# Patient Record
Sex: Male | Born: 1945
Health system: Southern US, Community
[De-identification: ages and names within clinical notes are randomized; demographics above are authoritative.]

## PROBLEM LIST (undated history)

## (undated) DIAGNOSIS — I10 Essential (primary) hypertension: Secondary | ICD-10-CM

## (undated) DIAGNOSIS — K922 Gastrointestinal hemorrhage, unspecified: Secondary | ICD-10-CM

## (undated) DIAGNOSIS — E119 Type 2 diabetes mellitus without complications: Secondary | ICD-10-CM

## (undated) DIAGNOSIS — E7800 Pure hypercholesterolemia, unspecified: Secondary | ICD-10-CM

## (undated) DIAGNOSIS — E079 Disorder of thyroid, unspecified: Secondary | ICD-10-CM

## (undated) HISTORY — PX: PACEMAKER INSERTION: SHX728

---

## 2014-03-24 ENCOUNTER — Encounter (HOSPITAL_BASED_OUTPATIENT_CLINIC_OR_DEPARTMENT_OTHER): Payer: Self-pay

## 2014-03-24 ENCOUNTER — Emergency Department (HOSPITAL_BASED_OUTPATIENT_CLINIC_OR_DEPARTMENT_OTHER): Payer: Medicare Other

## 2014-03-24 ENCOUNTER — Emergency Department (HOSPITAL_BASED_OUTPATIENT_CLINIC_OR_DEPARTMENT_OTHER)
Admission: EM | Admit: 2014-03-24 | Discharge: 2014-03-25 | Disposition: A | Discharge status: Home / Self Care | Payer: Medicare Other | Attending: Emergency Medicine | Admitting: Emergency Medicine

## 2014-03-24 DIAGNOSIS — E780 Pure hypercholesterolemia: Secondary | ICD-10-CM | POA: Insufficient documentation

## 2014-03-24 DIAGNOSIS — E079 Disorder of thyroid, unspecified: Secondary | ICD-10-CM | POA: Diagnosis not present

## 2014-03-24 DIAGNOSIS — R109 Unspecified abdominal pain: Secondary | ICD-10-CM | POA: Insufficient documentation

## 2014-03-24 DIAGNOSIS — Z8719 Personal history of other diseases of the digestive system: Secondary | ICD-10-CM | POA: Insufficient documentation

## 2014-03-24 DIAGNOSIS — R609 Edema, unspecified: Secondary | ICD-10-CM | POA: Diagnosis not present

## 2014-03-24 DIAGNOSIS — G5601 Carpal tunnel syndrome, right upper limb: Secondary | ICD-10-CM | POA: Diagnosis not present

## 2014-03-24 DIAGNOSIS — Z789 Other specified health status: Secondary | ICD-10-CM

## 2014-03-24 DIAGNOSIS — G5603 Carpal tunnel syndrome, bilateral upper limbs: Secondary | ICD-10-CM

## 2014-03-24 DIAGNOSIS — Z79899 Other long term (current) drug therapy: Secondary | ICD-10-CM | POA: Diagnosis not present

## 2014-03-24 DIAGNOSIS — G5602 Carpal tunnel syndrome, left upper limb: Secondary | ICD-10-CM | POA: Diagnosis not present

## 2014-03-24 DIAGNOSIS — I10 Essential (primary) hypertension: Secondary | ICD-10-CM | POA: Diagnosis not present

## 2014-03-24 HISTORY — DX: Essential (primary) hypertension: I10

## 2014-03-24 HISTORY — DX: Disorder of thyroid, unspecified: E07.9

## 2014-03-24 HISTORY — DX: Pure hypercholesterolemia, unspecified: E78.00

## 2014-03-24 HISTORY — DX: Gastrointestinal hemorrhage, unspecified: K92.2

## 2014-03-24 LAB — CBC WITH DIFFERENTIAL/PLATELET
Basophils Absolute: 0 10*3/uL (ref 0.0–0.1)
Basophils Relative: 0 % (ref 0–1)
EOS ABS: 0 10*3/uL (ref 0.0–0.7)
EOS PCT: 1 % (ref 0–5)
HCT: 40.3 % (ref 39.0–52.0)
Hemoglobin: 13.3 g/dL (ref 13.0–17.0)
Lymphocytes Relative: 27 % (ref 12–46)
Lymphs Abs: 1.3 10*3/uL (ref 0.7–4.0)
MCH: 30.7 pg (ref 26.0–34.0)
MCHC: 33 g/dL (ref 30.0–36.0)
MCV: 93.1 fL (ref 78.0–100.0)
Monocytes Absolute: 0.6 10*3/uL (ref 0.1–1.0)
Monocytes Relative: 12 % (ref 3–12)
Neutro Abs: 2.9 10*3/uL (ref 1.7–7.7)
Neutrophils Relative %: 60 % (ref 43–77)
PLATELETS: 170 10*3/uL (ref 150–400)
RBC: 4.33 MIL/uL (ref 4.22–5.81)
RDW: 14.3 % (ref 11.5–15.5)
WBC: 4.7 10*3/uL (ref 4.0–10.5)

## 2014-03-24 MED ORDER — SODIUM CHLORIDE 0.9 % IV SOLN
INTRAVENOUS | Status: DC
  Administered 2014-03-24: via INTRAVENOUS

## 2014-03-24 NOTE — ED Notes (Addendum)
C/o abd pain x "months" then states "ain't no pain just feel full"

## 2014-03-24 NOTE — ED Notes (Signed)
Oral contrast given by CT.  Pt is drinking.

## 2014-03-24 NOTE — ED Provider Notes (Signed)
CSN: [id number]     Arrival date & time 03/24/14  2210 History  This chart was scribed for Hanley Seamen, MD by Abel Presto, ED Scribe. This patient was seen in room MH06/MH06 and the patient's care was started at 11:32 PM.    Chief Complaint  Patient presents with  . Abdominal Pain    Patient is a 68 y.o. male presenting with abdominal pain. The history is provided by the patient. No language interpreter was used.  Abdominal Pain   HPI Comments: Luiz Trumpower is a 68 y.o. male who presents to the Emergency Department complaining of intermittent abdominal tightness--but not frank pain--for the past two or three months. He states it feels like he's "eaten a whole chicken". He states eating worsens the feeling. He notes associated mild constipation. He has taken stool softener but states moving his bowels gives no relief. Pt notes incidentally his ankles have been swollen. He also notes pain and numbness in his hands onset 2 years ago and he has been dropping keys recently. [facility] him but he has been reticent to have this done. Pt denies nausea, vomiting, diarrhea and bloody stools.  Past Medical History  Diagnosis Date  . Hypertension   . Thyroid disease   . High cholesterol   . GI bleed    Past Surgical History  Procedure Laterality Date  . Pacemaker insertion     No family history on file. History  Substance Use Topics  . Smoking status: Never Smoker   . Smokeless tobacco: Not on file  . Alcohol Use: No    Review of Systems  Gastrointestinal: Positive for abdominal pain.   A complete 10 system review of systems was obtained and all systems are negative except as noted in the HPI and PMH.     Allergies  Review of patient's allergies indicates no known allergies.  Home Medications   Prior to Admission medications   Medication Sig Start Date End Date Taking? Authorizing Provider  AMIODARONE HCL PO Take by mouth.   Yes Historical  Provider, MD  ATORVASTATIN CALCIUM PO Take by mouth.   Yes Historical Provider, MD  carvedilol (COREG) 25 MG tablet Take 25 mg by mouth 2 (two) times daily with a meal.   Yes Historical Provider, MD  clopidogrel (PLAVIX) 75 MG tablet Take 75 mg by mouth daily.   Yes Historical Provider, MD  glipiZIDE (GLUCOTROL) 10 MG tablet Take 10 mg by mouth daily before breakfast.   Yes Historical Provider, MD  LEVOTHYROXINE SODIUM PO Take by mouth.   Yes Historical Provider, MD  lisinopril (PRINIVIL,ZESTRIL) 10 MG tablet Take 10 mg by mouth daily.   Yes Historical Provider, MD   BP 174/88 mmHg  Pulse 51  Temp(Src) 98.7 F (37.1 C) (Oral)  Resp 16  Ht 6\' 1"  (1.854 m)  Wt 209 lb (94.802 kg)  BMI 27.58 kg/m2  SpO2 99% Physical Exam General: Well-developed, well-nourished male in no acute distress; appearance consistent with age of record HENT: normocephalic; atraumatic Eyes: pupils equal, round and reactive to light; extraocular muscles intact Neck: supple Heart: regular rate and rhythm; no murmurs, rubs or gallops Lungs: clear to auscultation bilaterally Abdomen: soft; nondistended; mild diffused tenderness; no masses or hepatosplenomegaly; bowel sounds present Extremities: No deformity; full range of motion; pulses normal, +1 pitting edema of lower legs; positive Tinel's; positive Phalen's Neurologic: Awake, alert and oriented; motor function intact in all extremities and symmetric; no facial droop Skin:  Warm and dry Psychiatric: Normal mood and affect   ED Course  Procedures (including critical care time) DIAGNOSTIC STUDIES: Oxygen Saturation is 100% on room air, normal by my interpretation.    COORDINATION OF CARE: 11:43 PM Discussed treatment plan with patient at beside, the patient agrees with the plan and has no further questions at this time.     MDM   Nursing notes and vitals signs, including pulse oximetry, reviewed.  Summary of this visit's results, reviewed by  myself:  Labs:  Results for orders placed or performed during the hospital encounter of 03/24/14 (from the past 24 hour(s))  CBC with Differential     Status: None   Collection Time: 03/24/14 11:50 PM  Result Value Ref Range   WBC 4.7 4.0 - 10.5 K/uL   RBC 4.33 4.22 - 5.81 MIL/uL   Hemoglobin 13.3 13.0 - 17.0 g/dL   HCT 96.240.3 95.239.0 - 84.152.0 %   MCV 93.1 78.0 - 100.0 fL   MCH 30.7 26.0 - 34.0 pg   MCHC 33.0 30.0 - 36.0 g/dL   RDW 32.414.3 40.111.5 - 02.715.5 %   Platelets 170 150 - 400 K/uL   Neutrophils Relative % 60 43 - 77 %   Neutro Abs 2.9 1.7 - 7.7 K/uL   Lymphocytes Relative 27 12 - 46 %   Lymphs Abs 1.3 0.7 - 4.0 K/uL   Monocytes Relative 12 3 - 12 %   Monocytes Absolute 0.6 0.1 - 1.0 K/uL   Eosinophils Relative 1 0 - 5 %   Eosinophils Absolute 0.0 0.0 - 0.7 K/uL   Basophils Relative 0 0 - 1 %   Basophils Absolute 0.0 0.0 - 0.1 K/uL  Comprehensive metabolic panel     Status: Abnormal   Collection Time: 03/24/14 11:50 PM  Result Value Ref Range   Sodium 142 135 - 145 mmol/L   Potassium 3.9 3.5 - 5.1 mmol/L   Chloride 107 96 - 112 mEq/L   CO2 29 19 - 32 mmol/L   Glucose, Bld 97 70 - 99 mg/dL   BUN 18 6 - 23 mg/dL   Creatinine, Ser 2.531.24 0.50 - 1.35 mg/dL   Calcium 9.0 8.4 - 66.410.5 mg/dL   Total Protein 6.3 6.0 - 8.3 g/dL   Albumin 3.7 3.5 - 5.2 g/dL   AST 18 0 - 37 U/L   ALT 11 0 - 53 U/L   Alkaline Phosphatase 49 39 - 117 U/L   Total Bilirubin 0.6 0.3 - 1.2 mg/dL   GFR calc non Af Amer 58 (L) >90 mL/min   GFR calc Af Amer 67 (L) >90 mL/min   Anion gap 6 5 - 15  Lipase, blood     Status: None   Collection Time: 03/24/14 11:50 PM  Result Value Ref Range   Lipase 23 11 - 59 U/L    Imaging Studies: Ct Abdomen Pelvis W Contrast  03/25/2014   CLINICAL DATA:  Abdominal pain, bloating, constipation  EXAM: CT ABDOMEN AND PELVIS WITH CONTRAST  TECHNIQUE: Multidetector CT imaging of the abdomen and pelvis was performed using the standard protocol following bolus administration of  intravenous contrast.  CONTRAST:  100mL OMNIPAQUE IOHEXOL 300 MG/ML  SOLN  COMPARISON:  None.  FINDINGS: Lower chest:  Lung bases are clear.  ICD leads, incompletely visualized.  Hepatobiliary: Liver is within normal limits.  Gallbladder is unremarkable. No intrahepatic or extrahepatic ductal dilatation.  Pancreas: Within normal limits.  Spleen: Within normal limits.  Adrenals/Urinary Tract: Adrenal glands  are unremarkable.  9 mm cyst in the posterior left upper kidney (series 2/image 30). Kidneys are within normal limits. No renal calculi or hydronephrosis.  Bladder is within normal limits.  Stomach/Bowel: Stomach is unremarkable.  No evidence of bowel obstruction.  Normal appendix.  Vascular/Lymphatic: No evidence of abdominal aortic aneurysm.  No suspicious abdominopelvic lymphadenopathy.  Reproductive: Mild prostatomegaly.  Other: No abdominopelvic ascites.  Musculoskeletal: Degenerative changes of the visualized thoracolumbar spine.  IMPRESSION: No evidence of bowel obstruction.  Normal appendix.  No CT findings to account the patient's abdominal pain.   Electronically Signed   By: Charline BillsSriyesh  Krishnan M.D.   On: 03/25/2014 02:35   2:55 AM Patient advised of unremarkable workup. He was advised that over-the-counter medications such as simethicone or laxative may provide symptomatic relief.  I personally performed the services described in this documentation, which was scribed in my presence. The recorded information has been reviewed and is accurate.   Hanley SeamenJohn L Mercadez Heitman, MD 03/25/14 [phone]

## 2014-03-24 NOTE — ED Notes (Signed)
MD at bedside. 

## 2014-03-25 LAB — COMPREHENSIVE METABOLIC PANEL
ALK PHOS: 49 U/L (ref 39–117)
ALT: 11 U/L (ref 0–53)
AST: 18 U/L (ref 0–37)
Albumin: 3.7 g/dL (ref 3.5–5.2)
Anion gap: 6 (ref 5–15)
BILIRUBIN TOTAL: 0.6 mg/dL (ref 0.3–1.2)
BUN: 18 mg/dL (ref 6–23)
CALCIUM: 9 mg/dL (ref 8.4–10.5)
CO2: 29 mmol/L (ref 19–32)
Chloride: 107 mEq/L (ref 96–112)
Creatinine, Ser: 1.24 mg/dL (ref 0.50–1.35)
GFR calc Af Amer: 67 mL/min — ABNORMAL LOW (ref >90)
GFR, EST NON AFRICAN AMERICAN: 58 mL/min — AB (ref >90)
GLUCOSE: 97 mg/dL (ref 70–99)
Potassium: 3.9 mmol/L (ref 3.5–5.1)
Sodium: 142 mmol/L (ref 135–145)
TOTAL PROTEIN: 6.3 g/dL (ref 6.0–8.3)

## 2014-03-25 LAB — LIPASE, BLOOD: Lipase: 23 U/L (ref 11–59)

## 2014-03-25 MED ORDER — IOHEXOL 300 MG/ML  SOLN
25.0000 mL | Freq: Once | INTRAMUSCULAR | Status: AC | PRN
  Administered 2014-03-25: 25 mL via ORAL

## 2014-03-25 MED ORDER — IOHEXOL 300 MG/ML  SOLN
100.0000 mL | Freq: Once | INTRAMUSCULAR | Status: AC | PRN
  Administered 2014-03-25: 100 mL via INTRAVENOUS

## 2014-03-25 NOTE — ED Notes (Signed)
Patient transported to CT 

## 2014-03-25 NOTE — ED Notes (Signed)
MD at bedside discussing test results and dispo plan of care. 

## 2014-07-20 ENCOUNTER — Encounter (HOSPITAL_BASED_OUTPATIENT_CLINIC_OR_DEPARTMENT_OTHER): Payer: Self-pay | Admitting: *Deleted

## 2014-07-20 DIAGNOSIS — E785 Hyperlipidemia, unspecified: Secondary | ICD-10-CM | POA: Insufficient documentation

## 2014-07-20 DIAGNOSIS — I10 Essential (primary) hypertension: Secondary | ICD-10-CM | POA: Insufficient documentation

## 2014-07-20 DIAGNOSIS — Z79899 Other long term (current) drug therapy: Secondary | ICD-10-CM | POA: Diagnosis not present

## 2014-07-20 DIAGNOSIS — Z7902 Long term (current) use of antithrombotics/antiplatelets: Secondary | ICD-10-CM | POA: Insufficient documentation

## 2014-07-20 DIAGNOSIS — R197 Diarrhea, unspecified: Secondary | ICD-10-CM | POA: Insufficient documentation

## 2014-07-20 DIAGNOSIS — E079 Disorder of thyroid, unspecified: Secondary | ICD-10-CM | POA: Diagnosis not present

## 2014-07-20 DIAGNOSIS — Z8719 Personal history of other diseases of the digestive system: Secondary | ICD-10-CM | POA: Diagnosis not present

## 2014-07-20 DIAGNOSIS — Z950 Presence of cardiac pacemaker: Secondary | ICD-10-CM | POA: Insufficient documentation

## 2014-07-20 NOTE — ED Notes (Signed)
Pt here for diarrhea all day today. Pt has had the diarrhea x2 days and took loperamide otc but has not had any relief and has abdominal distention also.  Pt states that he has had about 20 episodes of diarrhea today.  No recent hospitalization.  No fever with this

## 2014-07-21 ENCOUNTER — Emergency Department (HOSPITAL_BASED_OUTPATIENT_CLINIC_OR_DEPARTMENT_OTHER)
Admission: EM | Admit: 2014-07-21 | Discharge: 2014-07-21 | Disposition: A | Discharge status: Home / Self Care | Payer: Medicare Other | Attending: Emergency Medicine | Admitting: Emergency Medicine

## 2014-07-21 ENCOUNTER — Emergency Department (HOSPITAL_BASED_OUTPATIENT_CLINIC_OR_DEPARTMENT_OTHER): Payer: Medicare Other

## 2014-07-21 ENCOUNTER — Encounter (HOSPITAL_BASED_OUTPATIENT_CLINIC_OR_DEPARTMENT_OTHER): Payer: Self-pay | Admitting: Emergency Medicine

## 2014-07-21 DIAGNOSIS — R197 Diarrhea, unspecified: Secondary | ICD-10-CM

## 2014-07-21 LAB — COMPREHENSIVE METABOLIC PANEL
ALT: 15 U/L (ref 0–53)
ANION GAP: 6 (ref 5–15)
AST: 21 U/L (ref 0–37)
Albumin: 3.8 g/dL (ref 3.5–5.2)
Alkaline Phosphatase: 59 U/L (ref 39–117)
BUN: 23 mg/dL (ref 6–23)
CHLORIDE: 108 mmol/L (ref 96–112)
CO2: 26 mmol/L (ref 19–32)
CREATININE: 1.41 mg/dL — AB (ref 0.50–1.35)
Calcium: 9.1 mg/dL (ref 8.4–10.5)
GFR calc Af Amer: 58 mL/min — ABNORMAL LOW (ref >90)
GFR calc non Af Amer: 50 mL/min — ABNORMAL LOW (ref >90)
Glucose, Bld: 81 mg/dL (ref 70–99)
Potassium: 4.3 mmol/L (ref 3.5–5.1)
Sodium: 140 mmol/L (ref 135–145)
Total Bilirubin: 0.6 mg/dL (ref 0.3–1.2)
Total Protein: 7.4 g/dL (ref 6.0–8.3)

## 2014-07-21 LAB — CBC WITH DIFFERENTIAL/PLATELET
BASOS ABS: 0 10*3/uL (ref 0.0–0.1)
BASOS PCT: 0 % (ref 0–1)
EOS ABS: 0.1 10*3/uL (ref 0.0–0.7)
EOS PCT: 1 % (ref 0–5)
HEMATOCRIT: 40.8 % (ref 39.0–52.0)
Hemoglobin: 13.6 g/dL (ref 13.0–17.0)
LYMPHS PCT: 13 % (ref 12–46)
Lymphs Abs: 0.9 10*3/uL (ref 0.7–4.0)
MCH: 31.3 pg (ref 26.0–34.0)
MCHC: 33.3 g/dL (ref 30.0–36.0)
MCV: 93.8 fL (ref 78.0–100.0)
Monocytes Absolute: 1.3 10*3/uL — ABNORMAL HIGH (ref 0.1–1.0)
Monocytes Relative: 19 % — ABNORMAL HIGH (ref 3–12)
NEUTROS ABS: 4.7 10*3/uL (ref 1.7–7.7)
Neutrophils Relative %: 67 % (ref 43–77)
PLATELETS: 178 10*3/uL (ref 150–400)
RBC: 4.35 MIL/uL (ref 4.22–5.81)
RDW: 14.6 % (ref 11.5–15.5)
WBC: 7.1 10*3/uL (ref 4.0–10.5)

## 2014-07-21 MED ORDER — SODIUM CHLORIDE 0.9 % IV BOLUS (SEPSIS)
1000.0000 mL | Freq: Once | INTRAVENOUS | Status: AC
  Administered 2014-07-21: 1000 mL via INTRAVENOUS

## 2014-07-21 MED ORDER — CETIRIZINE HCL 10 MG PO TABS: 10.0000 mg | ORAL_TABLET | Freq: Every day | ORAL | Status: DC

## 2014-07-21 MED ORDER — METOCLOPRAMIDE HCL 5 MG/ML IJ SOLN
10.0000 mg | Freq: Once | INTRAMUSCULAR | Status: AC
  Administered 2014-07-21: 10 mg via INTRAVENOUS
  Filled 2014-07-21: qty 2

## 2014-07-21 NOTE — ED Provider Notes (Signed)
CSN: [id number]     Arrival date & time 07/20/14  2232 History   First MD Initiated Contact with Patient 07/21/14 0103     Chief Complaint  Patient presents with  . Diarrhea     (Consider location/radiation/quality/duration/timing/severity/associated sxs/prior Treatment) Patient is a 69 y.o. male presenting with diarrhea. The history is provided by the patient.  Diarrhea Quality:  Watery Severity:  Severe Onset quality:  Sudden Timing:  Intermittent Progression:  Unchanged Relieved by:  Nothing Worsened by:  Anti-motility medications Ineffective treatments:  None tried Associated symptoms: no abdominal pain, no fever and no vomiting     Past Medical History  Diagnosis Date  . Hypertension   . Thyroid disease   . High cholesterol   . GI bleed    Past Surgical History  Procedure Laterality Date  . Pacemaker insertion     History reviewed. No pertinent family history. History  Substance Use Topics  . Smoking status: Never Smoker   . Smokeless tobacco: Not on file  . Alcohol Use: No    Review of Systems  Constitutional: Negative for fever.  Respiratory: Negative for cough and wheezing.   Cardiovascular: Negative for chest pain, palpitations and leg swelling.  Gastrointestinal: Positive for diarrhea. Negative for nausea, vomiting and abdominal pain.  All other systems reviewed and are negative.     Allergies  Review of patient's allergies indicates no known allergies.  Home Medications   Prior to Admission medications   Medication Sig Start Date End Date Taking? Authorizing Provider  AMIODARONE HCL PO Take by mouth.    Historical Provider, MD  ATORVASTATIN CALCIUM PO Take by mouth.    Historical Provider, MD  carvedilol (COREG) 25 MG tablet Take 25 mg by mouth 2 (two) times daily with a meal.    Historical Provider, MD  cetirizine (ZYRTEC ALLERGY) 10 MG tablet Take 1 tablet (10 mg total) by mouth daily. 07/21/14   Woodard Perrell, MD  clopidogrel (PLAVIX) 75 MG  tablet Take 75 mg by mouth daily.    Historical Provider, MD  glipiZIDE (GLUCOTROL) 10 MG tablet Take 10 mg by mouth daily before breakfast.    Historical Provider, MD  LEVOTHYROXINE SODIUM PO Take by mouth.    Historical Provider, MD  lisinopril (PRINIVIL,ZESTRIL) 10 MG tablet Take 10 mg by mouth daily.    Historical Provider, MD   BP 121/72 mmHg  Pulse 64  Temp(Src) 98.1 F (36.7 C) (Oral)  Resp 18  Wt 208 lb (94.348 kg)  SpO2 98% Physical Exam  Constitutional: He is oriented to person, place, and time. He appears well-developed and well-nourished. No distress.  HENT:  Head: Normocephalic and atraumatic.  Mouth/Throat: Oropharynx is clear and moist.  Eyes: Conjunctivae are normal. Pupils are equal, round, and reactive to light.  Neck: Normal range of motion. Neck supple.  Cardiovascular: Normal rate, regular rhythm and intact distal pulses.   Pulmonary/Chest: Effort normal and breath sounds normal. No respiratory distress. He has no wheezes. He has no rales.  Abdominal: Soft. He exhibits no distension. Bowel sounds are increased. There is no tenderness. There is no rebound and no guarding.  Musculoskeletal: Normal range of motion.  Neurological: He is alert and oriented to person, place, and time.  Skin: Skin is warm and dry.  Psychiatric: He has a normal mood and affect.    ED Course  Procedures (including critical care time) Labs Review Labs Reviewed  CBC WITH DIFFERENTIAL/PLATELET - Abnormal; Notable for the following:    Monocytes  Relative 19 (*)    Monocytes Absolute 1.3 (*)    All other components within normal limits  COMPREHENSIVE METABOLIC PANEL - Abnormal; Notable for the following:    Creatinine, Ser 1.41 (*)    GFR calc non Af Amer 50 (*)    GFR calc Af Amer 58 (*)    All other components within normal limits  URINALYSIS, ROUTINE W REFLEX MICROSCOPIC    Imaging Review Dg Abd Acute W/chest  07/21/2014   CLINICAL DATA:  Acute onset of abdominal distention and  diarrhea. Initial encounter.  EXAM: DG ABDOMEN ACUTE W/ 1V CHEST  COMPARISON:  CT of the abdomen and pelvis performed 03/25/2014  FINDINGS: The lungs are well-aerated and clear. There is no evidence of focal opacification, pleural effusion or pneumothorax. The cardiomediastinal silhouette is within normal limits. A pacemaker/AICD is noted overlying the left chest wall, with leads ending overlying the right atrium and right ventricle.  The visualized bowel gas pattern is unremarkable. Scattered fluid and air are seen within the colon; there is no evidence of small bowel dilatation to suggest obstruction. No free intra-abdominal air is identified on the provided upright view.  No acute osseous abnormalities are seen; the sacroiliac joints are unremarkable in appearance.  IMPRESSION: 1. Unremarkable bowel gas pattern; no free intra-abdominal air seen. 2. No acute cardiopulmonary process seen.   Electronically Signed   By: Roanna Raider M.D.   On: 07/21/2014 03:02     EKG Interpretation None      MDM   Final diagnoses:  Diarrhea    Viral etiology diarrhea.  Feels better post meds.  No greasy or fried foods.  Bland diet instructions.  Close follow up    Cornella Emmer, MD 07/21/14 [phone]

## 2014-07-21 NOTE — ED Notes (Signed)
MD at bedside. 

## 2015-10-17 ENCOUNTER — Encounter (HOSPITAL_BASED_OUTPATIENT_CLINIC_OR_DEPARTMENT_OTHER): Payer: Self-pay | Admitting: Emergency Medicine

## 2015-10-17 ENCOUNTER — Emergency Department (HOSPITAL_BASED_OUTPATIENT_CLINIC_OR_DEPARTMENT_OTHER)
Admission: EM | Admit: 2015-10-17 | Discharge: 2015-10-18 | Disposition: A | Discharge status: Home / Self Care | Payer: Medicare Other | Attending: Emergency Medicine | Admitting: Emergency Medicine

## 2015-10-17 DIAGNOSIS — R079 Chest pain, unspecified: Secondary | ICD-10-CM | POA: Insufficient documentation

## 2015-10-17 DIAGNOSIS — K0889 Other specified disorders of teeth and supporting structures: Secondary | ICD-10-CM | POA: Insufficient documentation

## 2015-10-17 DIAGNOSIS — I10 Essential (primary) hypertension: Secondary | ICD-10-CM | POA: Diagnosis not present

## 2015-10-17 NOTE — ED Notes (Signed)
Patient states that he feels like he has a toothache and Headache. The patient reports that he went to the [facility] and they did not do anything but tell him that it was a tooth ache. He felt like he needed a new opinion

## 2015-10-17 NOTE — ED Notes (Addendum)
Pt c/o left side facial pain. "I don't have teeth there, but it hurts up into my ear and head. Seen at [facility], but they didn't Rx anything."

## 2015-10-18 NOTE — ED Notes (Signed)
See paper charting for documentation during downtime.

## 2015-10-18 NOTE — ED Provider Notes (Signed)
CSN: [id number]     Arrival date & time 10/17/15  2248 History  By signing my name below, I, Linna Darner, attest that this documentation has been prepared under the direction and in the presence of non-physician practitioner, Buel Ream, PA-C. Electronically Signed: Linna Darner, Scribe. 10/18/2015. 12:01 AM.   Chief Complaint  Patient presents with  . Dental Pain    The history is provided by the patient. No language interpreter was used.     HPI Comments: Matthew Browning is a 70 y.o. male with PMHx of HTN, HLD, thyroid disease, and GI bleed who presents to the Emergency Department complaining of sudden onset, constant, left-sided dental pain for the last 4 days. Pt states it feels like he has an abscess in the left side of his mouth. He notes he does not have teeth in the painful area. Pt endorses pain radiation up the left side of his face. He endorses pain exacerbation with eating and chewing. He has taken Tylenol for his dental pain without relief. Pt states he cannot take ibuprofen. He denies any dental problems in the past. He further denies fever, chills, drainage, new CP, SOB, abdominal pain, nausea, vomiting, neuro deficits, or any other associated symptoms. Pt does not have a PCP or regular dentist. He is followed by the [place]. Pt wears a pacemaker and notes some intermittent chest pain at baseline.  Past Medical History:  Diagnosis Date  . GI bleed   . High cholesterol   . Hypertension   . Thyroid disease    Past Surgical History:  Procedure Laterality Date  . PACEMAKER INSERTION     History reviewed. No pertinent family history. Social History  Substance Use Topics  . Smoking status: Never Smoker  . Smokeless tobacco: Not on file  . Alcohol use No    Review of Systems  Constitutional: Negative for fever and chills.  HENT: Positive for dental problem (left sided) and ear pain (Left).   Respiratory: Negative for shortness of breath.   Cardiovascular: Positive for  chest pain (intermittent, at baseline).  Gastrointestinal: Negative for nausea, vomiting and abdominal pain.  Skin: Negative for rash and wound.  Neurological:       Negative for sensation loss.  Psychiatric/Behavioral: The patient is not nervous/anxious.     Allergies  Review of patient's allergies indicates no known allergies.  Home Medications   Prior to Admission medications   Medication Sig Start Date End Date Taking? Authorizing Provider  AMIODARONE HCL PO Take by mouth.    Historical Provider, MD  ATORVASTATIN CALCIUM PO Take by mouth.    Historical Provider, MD  carvedilol (COREG) 25 MG tablet Take 25 mg by mouth 2 (two) times daily with a meal.    Historical Provider, MD  cetirizine (ZYRTEC ALLERGY) 10 MG tablet Take 1 tablet (10 mg total) by mouth daily. 07/21/14   April Palumbo, MD  clopidogrel (PLAVIX) 75 MG tablet Take 75 mg by mouth daily.    Historical Provider, MD  glipiZIDE (GLUCOTROL) 10 MG tablet Take 10 mg by mouth daily before breakfast.    Historical Provider, MD  LEVOTHYROXINE SODIUM PO Take by mouth.    Historical Provider, MD  lisinopril (PRINIVIL,ZESTRIL) 10 MG tablet Take 10 mg by mouth daily.    Historical Provider, MD   BP 101/59 (BP Location: Right Arm)   Pulse (!) 58   Temp 98.1 F (36.7 C) (Oral)   Resp 18   Ht 6\' 1"  (1.854 m)   Wt 97.5  kg   SpO2 98%   BMI 28.37 kg/m  Physical Exam  Constitutional: He appears well-developed and well-nourished. No distress.  HENT:  Head: Normocephalic and atraumatic.    Right Ear: Tympanic membrane normal.  Left Ear: Tympanic membrane normal.  Mouth/Throat: Uvula is midline, oropharynx is clear and moist and mucous membranes are normal. No oral lesions. No trismus in the jaw. Abnormal dentition (patient without top teeth). No dental abscesses or uvula swelling. No oropharyngeal exudate, posterior oropharyngeal edema, posterior oropharyngeal erythema or tonsillar abscesses.  Tenderness to palpation to superior,  posterior left lateral gum and inner cheek  Eyes: Conjunctivae are normal. Pupils are equal, round, and reactive to light. Right eye exhibits no discharge. Left eye exhibits no discharge. No scleral icterus.  Neck: Normal range of motion. Neck supple. No thyromegaly present.  Cardiovascular: Normal rate, regular rhythm, normal heart sounds and intact distal pulses.  Exam reveals no gallop and no friction rub.   No murmur heard. Pulmonary/Chest: Effort normal and breath sounds normal. No stridor. No respiratory distress. He has no wheezes. He has no rales.  Abdominal: Soft. Bowel sounds are normal. He exhibits no distension. There is no tenderness. There is no rebound and no guarding.  Musculoskeletal: He exhibits no edema.  Lymphadenopathy:    He has no cervical adenopathy.  Neurological: He is alert. Coordination normal.  Skin: Skin is warm and dry. No rash noted. He is not diaphoretic. No pallor.  Psychiatric: He has a normal mood and affect.  Nursing note and vitals reviewed.   ED Course  Procedures (including critical care time)  DIAGNOSTIC STUDIES: Oxygen Saturation is 98% on RA, normal by my interpretation.    COORDINATION OF CARE: 12:05 AM Discussed treatment plan with pt at bedside and pt agreed to plan.  Labs Review Labs Reviewed - No data to display  Imaging Review No results found. I have personally reviewed and evaluated these images and lab results as part of my medical decision-making.   EKG Interpretation None       MDM   Patient with dentalgia and gum pain. Patient does not have teeth in the area he is reporting pain.  No abscess requiring immediate incision and drainage.  Exam not concerning for Ludwig's angina or pharyngeal abscess.  Will treat with Augmentin to cover for dental infection and parotitis/siaoladenitis. Discharged with short course of Norco for pain control. Pt instructed to follow-up with dentist, given dental resource guide. Discussed return  precautions. Pt safe for discharge. Patient also evaluated by Dr. Judd Lien who is in agreement with plan.    Final diagnoses:  Pain, dental    I personally performed the services described in this documentation, which was scribed in my presence. The recorded information has been reviewed and is accurate.   Emi Holes, PA-C 10/18/15 1446  Geoffery Lyons, MD 10/19/15 [phone]

## 2015-10-18 NOTE — ED Notes (Signed)
PA at bedside.

## 2016-05-29 ENCOUNTER — Emergency Department (HOSPITAL_BASED_OUTPATIENT_CLINIC_OR_DEPARTMENT_OTHER): Payer: Medicare Other

## 2016-05-29 ENCOUNTER — Emergency Department (HOSPITAL_BASED_OUTPATIENT_CLINIC_OR_DEPARTMENT_OTHER)
Admission: EM | Admit: 2016-05-29 | Discharge: 2016-05-29 | Disposition: A | Discharge status: Home / Self Care | Payer: Medicare Other | Attending: Emergency Medicine | Admitting: Emergency Medicine

## 2016-05-29 ENCOUNTER — Encounter (HOSPITAL_BASED_OUTPATIENT_CLINIC_OR_DEPARTMENT_OTHER): Payer: Self-pay | Admitting: Emergency Medicine

## 2016-05-29 DIAGNOSIS — Z7984 Long term (current) use of oral hypoglycemic drugs: Secondary | ICD-10-CM | POA: Insufficient documentation

## 2016-05-29 DIAGNOSIS — B9789 Other viral agents as the cause of diseases classified elsewhere: Secondary | ICD-10-CM

## 2016-05-29 DIAGNOSIS — R0981 Nasal congestion: Secondary | ICD-10-CM | POA: Diagnosis present

## 2016-05-29 DIAGNOSIS — J069 Acute upper respiratory infection, unspecified: Secondary | ICD-10-CM | POA: Insufficient documentation

## 2016-05-29 DIAGNOSIS — I10 Essential (primary) hypertension: Secondary | ICD-10-CM | POA: Insufficient documentation

## 2016-05-29 DIAGNOSIS — Z79899 Other long term (current) drug therapy: Secondary | ICD-10-CM | POA: Insufficient documentation

## 2016-05-29 NOTE — ED Provider Notes (Signed)
[facility] Provider Note   CSN: [id number] Arrival date & time: 05/29/16  1833   By signing my name below, I, Clarisse Gouge, attest that this documentation has been prepared under the direction and in the presence of Cathren Laine, MD. Electronically signed, Clarisse Gouge, ED Scribe. 05/29/16. 8:10 PM.   History   Chief Complaint Chief Complaint  Patient presents with  . Nasal Congestion   The history is provided by the patient and medical records. No language interpreter was used.    HPI Comments: Matthew Browning is a 71 y.o. male who presents to the Emergency Department complaining of productive cough with occasional small amt yellow sputum x 4 days. He notes associated congestion and rhinorrhea. Hx of seasonal allergies and pacemaker placement noted. Pt denies fever, sore throat, chest pain, SOB and sinus pain.     Past Medical History:  Diagnosis Date  . GI bleed   . High cholesterol   . Hypertension   . Thyroid disease     There are no active problems to display for this patient.   Past Surgical History:  Procedure Laterality Date  . PACEMAKER INSERTION         Home Medications    Prior to Admission medications   Medication Sig Start Date End Date Taking? Authorizing Provider  AMIODARONE HCL PO Take by mouth.    Historical Provider, MD  ATORVASTATIN CALCIUM PO Take by mouth.    Historical Provider, MD  carvedilol (COREG) 25 MG tablet Take 25 mg by mouth 2 (two) times daily with a meal.    Historical Provider, MD  cetirizine (ZYRTEC ALLERGY) 10 MG tablet Take 1 tablet (10 mg total) by mouth daily. 07/21/14   April Palumbo, MD  clopidogrel (PLAVIX) 75 MG tablet Take 75 mg by mouth daily.    Historical Provider, MD  glipiZIDE (GLUCOTROL) 10 MG tablet Take 10 mg by mouth daily before breakfast.    Historical Provider, MD  LEVOTHYROXINE SODIUM PO Take by mouth.    Historical Provider, MD  lisinopril (PRINIVIL,ZESTRIL) 10 MG tablet Take 10 mg by mouth  daily.    Historical Provider, MD    Family History No family history on file.  Social History Social History  Substance Use Topics  . Smoking status: Never Smoker  . Smokeless tobacco: Never Used  . Alcohol use No     Allergies   Patient has no known allergies.   Review of Systems Review of Systems  Constitutional: Negative for fever.  HENT: Positive for congestion and rhinorrhea. Negative for sinus pain and sore throat.   Respiratory: Positive for cough. Negative for shortness of breath.   Cardiovascular: Negative for chest pain and leg swelling.  Gastrointestinal: Negative for vomiting.  Neurological: Negative for headaches.     Physical Exam Updated Vital Signs BP 120/73 (BP Location: Right Arm)   Pulse (!) 55   Temp 99 F (37.2 C) (Oral)   Resp 18   Ht 6\' 1"  (1.854 m)   Wt 210 lb (95.3 kg)   SpO2 99%   BMI 27.71 kg/m   Physical Exam  Constitutional: He is oriented to person, place, and time. He appears well-developed and well-nourished.  HENT:  Head: Normocephalic and atraumatic.  Mouth/Throat: Oropharynx is clear and moist.  Mild nasal congestion noted  Eyes: Conjunctivae and EOM are normal.  Neck: Normal range of motion. Neck supple. No JVD present.  No stiffness or rigidity  Cardiovascular: Normal rate, regular rhythm and normal heart sounds.  Exam reveals no gallop and no friction rub.   No murmur heard. Pulmonary/Chest: Effort normal and breath sounds normal. No respiratory distress. He has no wheezes.  Abdominal: Soft. He exhibits no distension. There is no tenderness. There is no rebound and no guarding.  Musculoskeletal: He exhibits no edema or tenderness.  Neurological: He is alert and oriented to person, place, and time.  Skin: No rash noted. No pallor.  Psychiatric: He has a normal mood and affect. His behavior is normal.  Nursing note and vitals reviewed.    ED Treatments / Results  DIAGNOSTIC STUDIES: Oxygen Saturation is 99% on RA,  normal by my interpretation.    COORDINATION OF CARE: 8:07 PM Discussed treatment plan with pt at bedside and pt agreed to plan. Will order imaging and labs.  Labs (all labs ordered are listed, but only abnormal results are displayed) Labs Reviewed - No data to display  EKG  EKG Interpretation None       Radiology Dg Chest 2 View  Result Date: 05/29/2016 CLINICAL DATA:  Acute onset of cough and congestion. Initial encounter. EXAM: CHEST  2 VIEW COMPARISON:  Chest radiograph performed 07/21/2014 FINDINGS: The lungs are well-aerated and clear. There is no evidence of focal opacification, pleural effusion or pneumothorax. The heart is normal in size; a pacemaker/AICD is noted at the left chest wall, with leads ending at the right atrium and right ventricle. No acute osseous abnormalities are seen. IMPRESSION: No acute cardiopulmonary process seen. Electronically Signed   By: Roanna RaiderJeffery  Chang M.D.   On: 05/29/2016 19:19    Procedures Procedures (including critical care time)  Medications Ordered in ED Medications - No data to display   Initial Impression / Assessment and Plan / ED Course  I have reviewed the triage vital signs and the nursing notes.  Pertinent labs & imaging results that were available during my care of the patient were reviewed by me and considered in my medical decision making (see chart for details).  I personally performed the services described in this documentation, which was scribed in my presence. The recorded information has been reviewed and considered. Cathren LaineKevin Eleina Jergens, MD  cxr neg acute.  Symptoms felt most c/w viral uri.  No increased wob, pt appears stable for d/c.     Final Clinical Impressions(s) / ED Diagnoses   Final diagnoses:  None    New Prescriptions New Prescriptions   No medications on file     Cathren LaineKevin Bryanna Yim, MD 05/29/16 2015

## 2016-05-29 NOTE — ED Notes (Signed)
Pt discharged to home with family. NAD.  

## 2016-05-29 NOTE — ED Triage Notes (Signed)
Nasal congestion and slight cough since Thursday .

## 2016-05-29 NOTE — Discharge Instructions (Signed)
It was our pleasure to provide your ER care today - we hope that you feel better.  Your chest xray looks good - no pneumonia.    Your symptoms and exam and most consistent with a viral upper respiratory illness which should run its course and get better in the next several days.  Rest. Drink plenty of fluids.   You may try an over the counter cold/flu medication as need for symptom improvement for your cough and nasal congestion.   Follow up with primary care doctor in 1 week if symptoms fail to improve/resolve.  Return to ER if worse, trouble breathing, other concern.

## 2016-12-05 ENCOUNTER — Encounter (HOSPITAL_BASED_OUTPATIENT_CLINIC_OR_DEPARTMENT_OTHER): Payer: Self-pay | Admitting: *Deleted

## 2016-12-05 ENCOUNTER — Emergency Department (HOSPITAL_BASED_OUTPATIENT_CLINIC_OR_DEPARTMENT_OTHER)
Admission: EM | Admit: 2016-12-05 | Discharge: 2016-12-05 | Disposition: A | Discharge status: Home / Self Care | Payer: Medicare Other | Attending: Emergency Medicine | Admitting: Emergency Medicine

## 2016-12-05 DIAGNOSIS — Z79899 Other long term (current) drug therapy: Secondary | ICD-10-CM | POA: Diagnosis not present

## 2016-12-05 DIAGNOSIS — A6000 Herpesviral infection of urogenital system, unspecified: Secondary | ICD-10-CM | POA: Diagnosis not present

## 2016-12-05 DIAGNOSIS — E119 Type 2 diabetes mellitus without complications: Secondary | ICD-10-CM | POA: Diagnosis not present

## 2016-12-05 DIAGNOSIS — Z950 Presence of cardiac pacemaker: Secondary | ICD-10-CM | POA: Insufficient documentation

## 2016-12-05 DIAGNOSIS — N4889 Other specified disorders of penis: Secondary | ICD-10-CM | POA: Insufficient documentation

## 2016-12-05 DIAGNOSIS — Z7902 Long term (current) use of antithrombotics/antiplatelets: Secondary | ICD-10-CM | POA: Insufficient documentation

## 2016-12-05 DIAGNOSIS — Z7984 Long term (current) use of oral hypoglycemic drugs: Secondary | ICD-10-CM | POA: Diagnosis not present

## 2016-12-05 DIAGNOSIS — B009 Herpesviral infection, unspecified: Secondary | ICD-10-CM

## 2016-12-05 DIAGNOSIS — R21 Rash and other nonspecific skin eruption: Secondary | ICD-10-CM | POA: Diagnosis present

## 2016-12-05 HISTORY — DX: Type 2 diabetes mellitus without complications: E11.9

## 2016-12-05 MED ORDER — ACYCLOVIR 400 MG PO TABS: 400.0000 mg | ORAL_TABLET | Freq: Three times a day (TID) | ORAL | 0 refills | Status: DC

## 2016-12-05 MED FILL — ACYCLOVIR 400 MG TABLET: 400 | 7 days supply | Qty: 21 | Fill #0
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
## 2016-12-05 NOTE — ED Notes (Addendum)
Pt directed to pharmacy to pick up RX. Dr. Anitra LauthPlunkett at bedside to speak to pt prior to d/c.

## 2016-12-05 NOTE — Discharge Instructions (Signed)
Please read attached information. If you experience any new or worsening signs or symptoms please return to the emergency room for evaluation. Please follow-up with your primary care provider or specialist as discussed. Please use medication prescribed only as directed and discontinue taking if you have any concerning signs or symptoms.   °

## 2016-12-05 NOTE — ED Triage Notes (Signed)
Pt reports rash to groin area. Denies sex. Started 2 days ago. Received Rx for hydrocortisone cream from [facility] but states it isn't working

## 2017-09-23 ENCOUNTER — Emergency Department (HOSPITAL_BASED_OUTPATIENT_CLINIC_OR_DEPARTMENT_OTHER)
Admission: EM | Admit: 2017-09-23 | Discharge: 2017-09-23 | Disposition: A | Discharge status: Home / Self Care | Payer: No Typology Code available for payment source | Attending: Emergency Medicine | Admitting: Emergency Medicine

## 2017-09-23 ENCOUNTER — Other Ambulatory Visit: Payer: Self-pay

## 2017-09-23 ENCOUNTER — Encounter (HOSPITAL_BASED_OUTPATIENT_CLINIC_OR_DEPARTMENT_OTHER): Payer: Self-pay | Admitting: Emergency Medicine

## 2017-09-23 ENCOUNTER — Emergency Department (HOSPITAL_BASED_OUTPATIENT_CLINIC_OR_DEPARTMENT_OTHER): Payer: No Typology Code available for payment source

## 2017-09-23 DIAGNOSIS — E119 Type 2 diabetes mellitus without complications: Secondary | ICD-10-CM | POA: Insufficient documentation

## 2017-09-23 DIAGNOSIS — I10 Essential (primary) hypertension: Secondary | ICD-10-CM | POA: Diagnosis not present

## 2017-09-23 DIAGNOSIS — Z7902 Long term (current) use of antithrombotics/antiplatelets: Secondary | ICD-10-CM | POA: Insufficient documentation

## 2017-09-23 DIAGNOSIS — Z79899 Other long term (current) drug therapy: Secondary | ICD-10-CM | POA: Diagnosis not present

## 2017-09-23 DIAGNOSIS — E7800 Pure hypercholesterolemia, unspecified: Secondary | ICD-10-CM | POA: Insufficient documentation

## 2017-09-23 DIAGNOSIS — R2241 Localized swelling, mass and lump, right lower limb: Secondary | ICD-10-CM | POA: Insufficient documentation

## 2017-09-23 DIAGNOSIS — Z7984 Long term (current) use of oral hypoglycemic drugs: Secondary | ICD-10-CM | POA: Insufficient documentation

## 2017-09-23 DIAGNOSIS — M25471 Effusion, right ankle: Secondary | ICD-10-CM

## 2017-09-23 NOTE — ED Notes (Signed)
Patient transported to Ultrasound 

## 2017-09-23 NOTE — Discharge Instructions (Signed)
As we discussed, elevate your legs at night to help with swelling.  He can wear compression socks with walking.  He can also apply ice to help with swelling.  Follow up with your primary care doctor in the next few days for further evaluation.  Return the emergency department for any worsening swelling, pain, redness or warmth of the ankle, fevers, numbness/weakness or any other worsening or concerning symptoms.

## 2017-09-23 NOTE — ED Provider Notes (Signed)
[facility] EMERGENCY DEPARTMENT Provider Note   CSN: [id number] Arrival date & time: 09/23/17  1319     History   Chief Complaint Chief Complaint  Patient presents with  . Ankle Pain    HPI Matthew Browning is a 72 y.o. male with PMH/o DM, GI bleed, HTN who presents for evaluation of 3 days of right ankle swelling.  Patient denies any preceding trauma, injury, fall.  He denies any pain, overlying warmth, erythema.  Patient reports he has been able to ambulate.  Patient reports that he notices socks were tighter today when he tried put them on.  Patient does report that he feels like the swelling has gotten better since he came to the ED. he states that sometimes he always has a little bit of swelling in his feet.  He reports that it is worse when he walks around all day but gets better when he goes home at night.  Patient denies any fevers, numbness/weakness.  Patient denies any recent surgeries, periods of immobilization, long plane, car, train rides.  Denies any history of blood clots in his legs or his lungs.  The history is provided by the patient.    Past Medical History:  Diagnosis Date  . Diabetes mellitus without complication (HCC)   . GI bleed   . High cholesterol   . Hypertension   . Thyroid disease     There are no active problems to display for this patient.   Past Surgical History:  Procedure Laterality Date  . PACEMAKER INSERTION          Home Medications    Prior to Admission medications   Medication Sig Start Date End Date Taking? Authorizing Provider  acyclovir (ZOVIRAX) 400 MG tablet Take 1 tablet (400 mg total) by mouth 3 (three) times daily. 12/05/16   Hedges, Tinnie Gens, PA-C  AMIODARONE HCL PO Take by mouth.    [provider]  ATORVASTATIN CALCIUM PO Take by mouth.    [provider]  carvedilol (COREG) 25 MG tablet Take 25 mg by mouth 2 (two) times daily with a meal.    [provider]  cetirizine (ZYRTEC  ALLERGY) 10 MG tablet Take 1 tablet (10 mg total) by mouth daily. 07/21/14   Palumbo, April, MD  clopidogrel (PLAVIX) 75 MG tablet Take 75 mg by mouth daily.    [provider]  glipiZIDE (GLUCOTROL) 10 MG tablet Take 10 mg by mouth daily before breakfast.    [provider]  LEVOTHYROXINE SODIUM PO Take by mouth.    [provider]  lisinopril (PRINIVIL,ZESTRIL) 10 MG tablet Take 10 mg by mouth daily.    [provider]    Family History No family history on file.  Social History Social History   Tobacco Use  . Smoking status: Never Smoker  . Smokeless tobacco: Never Used  Substance Use Topics  . Alcohol use: No  . Drug use: No     Allergies   Patient has no known allergies.   Review of Systems Review of Systems  Constitutional: Negative for fever.  Musculoskeletal:       Ankle swelling  Skin: Negative for color change.  Neurological: Negative for weakness and numbness.     Physical Exam Updated Vital Signs BP (!) 142/76   Pulse (!) 50   Temp 98.3 F (36.8 C) (Oral)   Resp 18   Ht 6\' 1"  (1.854 m)   Wt 95.3 kg (210 lb)   SpO2 100%  BMI 27.71 kg/m   Physical Exam  Constitutional: He appears well-developed and well-nourished.  HENT:  Head: Normocephalic and atraumatic.  Eyes: Conjunctivae and EOM are normal. Right eye exhibits no discharge. Left eye exhibits no discharge. No scleral icterus.  Cardiovascular:  Pulses:      Dorsalis pedis pulses are 2+ on the right side, and 2+ on the left side.  Pulmonary/Chest: Effort normal.  Musculoskeletal:  Mild overlying soft tissue swelling to the right ankle.  No overlying warmth, erythema.  Tenderness palpation.  No deformity or crepitus noted.  Plantar/dorsiflexion of right foot intact without any difficulty.  There is some mild overlying swelling of the right lower extremity that makes it slightly larger than left lower extremity.  No calf tenderness palpation.  No abnormalities of  the left lower extremity.  Neurological: He is alert.  Sensation intact along major nerve distributions of BLE  Skin: Skin is warm and dry. Capillary refill takes less than 2 seconds.  Good distal cap refill. BLE are not dusky in appearance or cool to touch.  Psychiatric: He has a normal mood and affect. His speech is normal and behavior is normal.  Nursing note and vitals reviewed.    ED Treatments / Results  Labs (all labs ordered are listed, but only abnormal results are displayed) Labs Reviewed - No data to display  EKG None  Radiology Dg Ankle Complete Right  Result Date: 09/23/2017 CLINICAL DATA:  Lateral swelling for several days, no known injury, initial encounter EXAM: RIGHT ANKLE - COMPLETE 3+ VIEW COMPARISON:  None. FINDINGS: Mild generalized soft tissue swelling is noted. No acute fracture or dislocation is noted. Calcaneal spurring is noted as well as tarsal degenerative change. IMPRESSION: Mild degenerative change without acute abnormality. Electronically Signed   By: Alcide Clever M.D.   On: 09/23/2017 16:05   US Venous Img Lower Right (dvt Study)  Result Date: 09/23/2017 CLINICAL DATA:  Right leg pain and swelling for several days EXAM: RIGHT LOWER EXTREMITY VENOUS DOPPLER ULTRASOUND TECHNIQUE: Gray-scale sonography with graded compression, as well as color Doppler and duplex ultrasound were performed to evaluate the lower extremity deep venous systems from the level of the common femoral vein and including the common femoral, femoral, profunda femoral, popliteal and calf veins including the posterior tibial, peroneal and gastrocnemius veins when visible. The superficial great saphenous vein was also interrogated. Spectral Doppler was utilized to evaluate flow at rest and with distal augmentation maneuvers in the common femoral, femoral and popliteal veins. COMPARISON:  None. FINDINGS: Contralateral Common Femoral Vein: Respiratory phasicity is normal and symmetric with the  symptomatic side. No evidence of thrombus. Normal compressibility. Common Femoral Vein: No evidence of thrombus. Normal compressibility, respiratory phasicity and response to augmentation. Saphenofemoral Junction: No evidence of thrombus. Normal compressibility and flow on color Doppler imaging. Profunda Femoral Vein: No evidence of thrombus. Normal compressibility and flow on color Doppler imaging. Femoral Vein: No evidence of thrombus. Normal compressibility, respiratory phasicity and response to augmentation. Popliteal Vein: No evidence of thrombus. Normal compressibility, respiratory phasicity and response to augmentation. Calf Veins: No evidence of thrombus. Normal compressibility and flow on color Doppler imaging. Superficial Great Saphenous Vein: No evidence of thrombus. Normal compressibility. Venous Reflux:  None. Other Findings:  None. IMPRESSION: No evidence of deep venous thrombosis. Electronically Signed   By: Alcide Clever M.D.   On: 09/23/2017 18:17    Procedures Procedures (including critical care time)  Medications Ordered in ED Medications - No data to display  Initial Impression / Assessment and Plan / ED Course  I have reviewed the triage vital signs and the nursing notes.  Pertinent labs & imaging results that were available during my care of the patient were reviewed by me and considered in my medical decision making (see chart for details).     72 year old male who presents for evaluation of right ankle swelling x2 days.  No preceding trauma, injury, fall.,  No overlying warmth, erythema.  No fevers, numbness/weakness. Patient is afebrile, non-toxic appearing, sitting comfortably on examination table. Vital signs reviewed and stable. Patient is neurovascularly intact.  On exam, he has some very mild overlying ankle swelling to the right ankle.  Right lower extremity slightly larger than left lower extremity.  No red warmth, erythema.  No tenderness palpation.  No calf  tenderness.  Patient with no DVT risk factors.  History/physical exam is not concerning for acute arterial embolism, septic arthritis.  Consider peripheral vascular disease.  Low suspicion for fracture, dislocation sprain but also consideration.  Low suspicion for DVT but given slight asymmetry, also consideration.  X-ray reviewed.  Negative for any acute fracture dislocation.  Ultrasound of right lower extremity reviewed.  No evidence of DVT, superficial thrombus phlebitis.  Reevaluation.  Patient in no pain or distress.  Repeat vitals stable.  He is slightly bradycardic but records reviewed show that he is always baseline bradycardic.  No evidence of infectious etiology that would be concerning for patient's symptoms.  I suspect that this may be peripheral vascular disease/chronic venous stasis.. Instructed the patient on supportive at home therapies.  Encourage primary care follow-up. Patient had ample opportunity for questions and discussion. All patient's questions were answered with full understanding. Strict return precautions discussed. Patient expresses understanding and agreement to plan.   Final Clinical Impressions(s) / ED Diagnoses   Final diagnoses:  Right ankle swelling    ED Discharge Orders    None       Rosana HoesLayden, Lindsey A, PA-C 09/23/17 1930    Little, Ambrose Finlandachel Morgan, MD 09/26/17 2138

## 2017-09-23 NOTE — ED Triage Notes (Signed)
Pt reports swelling to R ankle x 3 days. Denies pain, injury.

## 2019-09-09 ENCOUNTER — Encounter (HOSPITAL_BASED_OUTPATIENT_CLINIC_OR_DEPARTMENT_OTHER): Payer: Self-pay

## 2019-09-09 ENCOUNTER — Emergency Department (HOSPITAL_BASED_OUTPATIENT_CLINIC_OR_DEPARTMENT_OTHER): Payer: No Typology Code available for payment source

## 2019-09-09 ENCOUNTER — Other Ambulatory Visit: Payer: Self-pay

## 2019-09-09 ENCOUNTER — Emergency Department (HOSPITAL_BASED_OUTPATIENT_CLINIC_OR_DEPARTMENT_OTHER)
Admission: EM | Admit: 2019-09-09 | Discharge: 2019-09-10 | Disposition: A | Discharge status: Home / Self Care | Payer: No Typology Code available for payment source | Attending: Emergency Medicine | Admitting: Emergency Medicine

## 2019-09-09 DIAGNOSIS — R55 Syncope and collapse: Secondary | ICD-10-CM | POA: Insufficient documentation

## 2019-09-09 DIAGNOSIS — E119 Type 2 diabetes mellitus without complications: Secondary | ICD-10-CM | POA: Diagnosis not present

## 2019-09-09 DIAGNOSIS — Z7984 Long term (current) use of oral hypoglycemic drugs: Secondary | ICD-10-CM | POA: Diagnosis not present

## 2019-09-09 DIAGNOSIS — Z7901 Long term (current) use of anticoagulants: Secondary | ICD-10-CM | POA: Diagnosis not present

## 2019-09-09 DIAGNOSIS — I10 Essential (primary) hypertension: Secondary | ICD-10-CM | POA: Diagnosis not present

## 2019-09-09 DIAGNOSIS — Z95810 Presence of automatic (implantable) cardiac defibrillator: Secondary | ICD-10-CM | POA: Diagnosis not present

## 2019-09-09 DIAGNOSIS — R42 Dizziness and giddiness: Secondary | ICD-10-CM | POA: Diagnosis not present

## 2019-09-09 LAB — BASIC METABOLIC PANEL
Anion gap: 10 (ref 5–15)
BUN: 17 mg/dL (ref 8–23)
CO2: 27 mmol/L (ref 22–32)
Calcium: 8.7 mg/dL — ABNORMAL LOW (ref 8.9–10.3)
Chloride: 103 mmol/L (ref 98–111)
Creatinine, Ser: 1.41 mg/dL — ABNORMAL HIGH (ref 0.61–1.24)
GFR calc Af Amer: 56 mL/min — ABNORMAL LOW (ref >60)
GFR calc non Af Amer: 49 mL/min — ABNORMAL LOW (ref >60)
Glucose, Bld: 173 mg/dL — ABNORMAL HIGH (ref 70–99)
Potassium: 4.4 mmol/L (ref 3.5–5.1)
Sodium: 140 mmol/L (ref 135–145)

## 2019-09-09 LAB — CBC
HCT: 40.1 % (ref 39.0–52.0)
Hemoglobin: 12.7 g/dL — ABNORMAL LOW (ref 13.0–17.0)
MCH: 30.4 pg (ref 26.0–34.0)
MCHC: 31.7 g/dL (ref 30.0–36.0)
MCV: 95.9 fL (ref 80.0–100.0)
Platelets: 183 10*3/uL (ref 150–400)
RBC: 4.18 MIL/uL — ABNORMAL LOW (ref 4.22–5.81)
RDW: 14.6 % (ref 11.5–15.5)
WBC: 5.4 10*3/uL (ref 4.0–10.5)
nRBC: 0 % (ref 0.0–0.2)

## 2019-09-09 LAB — CBG MONITORING, ED: Glucose-Capillary: 163 mg/dL — ABNORMAL HIGH (ref 70–99)

## 2019-09-09 LAB — TROPONIN I (HIGH SENSITIVITY): Troponin I (High Sensitivity): 10 ng/L (ref <18)

## 2019-09-09 NOTE — ED Triage Notes (Signed)
30 minutes PTA pt c/o lightheaded, hot, and started belching. Pt states he put a cool rag on and drank some water which resolved his symptoms.

## 2019-09-09 NOTE — ED Provider Notes (Signed)
[facility] Provider Note   CSN: [id number] Arrival date & time: 09/09/19  2122     History Chief Complaint  Patient presents with  . Near Syncope    [facility] is a 74 y.o. male.  HPI Patient presents with episode where he felt lightheaded hot and was belching.  States he was struggling to have a bowel movement.  States he had difficulty going then was able to go and after that felt bad.  Feeling better now.  No chest pain.  No trouble breathing.  States he felt little dizzy at the time.  States he had some sort of imaging done in [place] today.  Not sure what part of the body was done on.  States it was like a doughnut.  May have been for his back.  May have been because some blood work was elevated.  States there is plan to put a needle in it see if he has cancer.  Patient does have an AICD.  Unsure exactly why..   Past Medical History:  Diagnosis Date  . Diabetes mellitus without complication ([facility])   . GI bleed   . High cholesterol   . Hypertension   . Thyroid disease     There are no problems to display for this patient.   Past Surgical History:  Procedure Laterality Date  . PACEMAKER INSERTION         No family history on file.  Social History   Tobacco Use  . Smoking status: Never Smoker  . Smokeless tobacco: Never Used  Vaping Use  . Vaping Use: Never used  Substance Use Topics  . Alcohol use: No  . Drug use: No    Home Medications Prior to Admission medications   Medication Sig Start Date End Date Taking? Authorizing Provider  acyclovir (ZOVIRAX) 400 MG tablet Take 1 tablet (400 mg total) by mouth 3 (three) times daily. 12/05/16   Hedges, Dellis Filbert, PA-C  AMIODARONE HCL PO Take by mouth.    [provider]  ATORVASTATIN CALCIUM PO Take by mouth.    [provider]  carvedilol (COREG) 25 MG tablet Take 25 mg by mouth 2 (two) times daily with a meal.    [provider]  cetirizine (ZYRTEC  ALLERGY) 10 MG tablet Take 1 tablet (10 mg total) by mouth daily. 07/21/14   Palumbo, April, MD  clopidogrel (PLAVIX) 75 MG tablet Take 75 mg by mouth daily.    [provider]  glipiZIDE (GLUCOTROL) 10 MG tablet Take 10 mg by mouth daily before breakfast.    [provider]  LEVOTHYROXINE SODIUM PO Take by mouth.    [provider]  lisinopril (PRINIVIL,ZESTRIL) 10 MG tablet Take 10 mg by mouth daily.    [provider]    Allergies    Patient has no known allergies.  Review of Systems   Review of Systems  Constitutional: Negative for appetite change and fever.  Respiratory: Negative for shortness of breath.   Gastrointestinal: Negative for abdominal distention.  Genitourinary: Negative for flank pain.  Musculoskeletal: Negative for back pain.  Skin: Negative for rash.  Psychiatric/Behavioral: Negative for agitation.    Physical Exam Updated Vital Signs BP (!) 141/75 (BP Location: Right Arm)   Pulse 63   Temp 97.8 F (36.6 C) (Oral)   Resp 18   Ht 6\' 1"  (1.854 m)   Wt 96.6 kg   SpO2 99%   BMI 28.10 kg/m   Physical Exam Vitals  and nursing note reviewed.  Constitutional:      Appearance: Normal appearance.  HENT:     Head: Atraumatic.  Eyes:     Extraocular Movements: Extraocular movements intact.  Cardiovascular:     Rate and Rhythm: Regular rhythm.  Pulmonary:     Breath sounds: No wheezing or rhonchi.     Comments: AICD left upper chest wall Abdominal:     Tenderness: There is no abdominal tenderness.  Musculoskeletal:        General: No tenderness.     Cervical back: Neck supple.  Skin:    General: Skin is warm.     Capillary Refill: Capillary refill takes less than 2 seconds.  Neurological:     Mental Status: He is oriented to person, place, and time.     ED Results / Procedures / Treatments   Labs (all labs ordered are listed, but only abnormal results are displayed) Labs Reviewed  BASIC METABOLIC PANEL -  Abnormal; Notable for the following components:      Result Value   Glucose, Bld 173 (*)    Creatinine, Ser 1.41 (*)    Calcium 8.7 (*)    GFR calc non Af Amer 49 (*)    GFR calc Af Amer 56 (*)    All other components within normal limits  CBC - Abnormal; Notable for the following components:   RBC 4.18 (*)    Hemoglobin 12.7 (*)    All other components within normal limits  CBG MONITORING, ED - Abnormal; Notable for the following components:   Glucose-Capillary 163 (*)    All other components within normal limits  URINALYSIS, ROUTINE W REFLEX MICROSCOPIC  CBG MONITORING, ED  TROPONIN I (HIGH SENSITIVITY)  TROPONIN I (HIGH SENSITIVITY)    EKG EKG Interpretation  Date/Time:  Monday September 09 2019 21:33:21 EDT Ventricular Rate:  61 PR Interval:  206 QRS Duration: 86 QT Interval:  426 QTC Calculation: 428 R Axis:   26 Text Interpretation: Normal sinus rhythm Low voltage QRS Borderline ECG Confirmed by Benjiman Core [phone]) on 09/09/2019 11:46:10 PM   Radiology DG Chest 2 View  Result Date: 09/09/2019 CLINICAL DATA:  Near syncope. EXAM: CHEST - 2 VIEW COMPARISON:  Radiograph 05/29/2016 FINDINGS: Dual lead left-sided pacemaker remains in place. Unchanged heart size and mediastinal contours. Mild chronic elevation of right hemidiaphragm. No focal airspace disease, pulmonary edema, pleural effusion or pneumothorax. No acute osseous abnormalities are seen. IMPRESSION: No acute chest findings. Electronically Signed   By: Narda Rutherford M.D.   On: 09/09/2019 23:14    Procedures Procedures (including critical care time)  Medications Ordered in ED Medications - No data to display  ED Course  I have reviewed the triage vital signs and the nursing notes.  Pertinent labs & imaging results that were available during my care of the patient were reviewed by me and considered in my medical decision making (see chart for details).    MDM Rules/Calculators/A&P                           Patient with near syncopal episode.  However happened right after likely big vagal stimulation with him trying to go to the bathroom.  EKG reassuring.  First troponin 10.  Lab work otherwise reassuring.  I think the fact that it started right below arrival patient benefit from a delta troponin.  I would like to interrogate his AICD however it is a [employer] and I  cannot interrogate that here.  I feel patient can probably be followed as an outpatient for that aspect of it.  Pulmonary embolism felt less likely.  Has been stable while in the ER.  Likely discharge home.  Care turned over to Dr. Read Drivers. Final Clinical Impression(s) / ED Diagnoses Final diagnoses:  Near syncope    Rx / DC Orders ED Discharge Orders    None       Benjiman Core, MD 09/09/19 2348

## 2019-09-09 NOTE — Discharge Instructions (Addendum)
Follow-up with cardiology to get your AICD interrogated.

## 2019-09-10 LAB — TROPONIN I (HIGH SENSITIVITY): Troponin I (High Sensitivity): 10 ng/L (ref <18)

## 2019-09-10 LAB — URINALYSIS, ROUTINE W REFLEX MICROSCOPIC
Bilirubin Urine: NEGATIVE
Glucose, UA: NEGATIVE mg/dL
Hgb urine dipstick: NEGATIVE
Ketones, ur: NEGATIVE mg/dL
Leukocytes,Ua: NEGATIVE
Nitrite: NEGATIVE
Protein, ur: NEGATIVE mg/dL
Specific Gravity, Urine: 1.025 (ref 1.005–1.030)
pH: 5.5 (ref 5.0–8.0)

## 2019-09-10 NOTE — ED Provider Notes (Signed)
Nursing notes and vitals signs, including pulse oximetry, reviewed.  Summary of this visit's results, reviewed by myself:  EKG:  EKG Interpretation  Date/Time:  Monday September 09 2019 21:33:21 EDT Ventricular Rate:  61 PR Interval:  206 QRS Duration: 86 QT Interval:  426 QTC Calculation: 428 R Axis:   26 Text Interpretation: Normal sinus rhythm Low voltage QRS Borderline ECG Confirmed by Benjiman Core [phone]) on 09/09/2019 11:46:10 PM       Labs:  Results for orders placed or performed during the hospital encounter of 09/09/19 (from the past 24 hour(s))  CBG monitoring, ED     Status: Abnormal   Collection Time: 09/09/19  9:32 PM  Result Value Ref Range   Glucose-Capillary 163 (H) 70 - 99 mg/dL   Comment 1 Notify RN   Basic metabolic panel     Status: Abnormal   Collection Time: 09/09/19 10:50 PM  Result Value Ref Range   Sodium 140 135 - 145 mmol/L   Potassium 4.4 3.5 - 5.1 mmol/L   Chloride 103 98 - 111 mmol/L   CO2 27 22 - 32 mmol/L   Glucose, Bld 173 (H) 70 - 99 mg/dL   BUN 17 8 - 23 mg/dL   Creatinine, Ser 2.69 (H) 0.61 - 1.24 mg/dL   Calcium 8.7 (L) 8.9 - 10.3 mg/dL   GFR calc non Af Amer 49 (L) >60 mL/min   GFR calc Af Amer 56 (L) >60 mL/min   Anion gap 10 5 - 15  CBC     Status: Abnormal   Collection Time: 09/09/19 10:50 PM  Result Value Ref Range   WBC 5.4 4.0 - 10.5 K/uL   RBC 4.18 (L) 4.22 - 5.81 MIL/uL   Hemoglobin 12.7 (L) 13.0 - 17.0 g/dL   HCT 48.5 39 - 52 %   MCV 95.9 80.0 - 100.0 fL   MCH 30.4 26.0 - 34.0 pg   MCHC 31.7 30.0 - 36.0 g/dL   RDW 46.2 70.3 - 50.0 %   Platelets 183 150 - 400 K/uL   nRBC 0.0 0.0 - 0.2 %  Troponin I (High Sensitivity)     Status: None   Collection Time: 09/09/19 10:50 PM  Result Value Ref Range   Troponin I (High Sensitivity) 10 <18 ng/L  Urinalysis, Routine w reflex microscopic     Status: None   Collection Time: 09/10/19 12:23 AM  Result Value Ref Range   Color, Urine YELLOW YELLOW   APPearance CLEAR CLEAR    Specific Gravity, Urine 1.025 1.005 - 1.030   pH 5.5 5.0 - 8.0   Glucose, UA NEGATIVE NEGATIVE mg/dL   Hgb urine dipstick NEGATIVE NEGATIVE   Bilirubin Urine NEGATIVE NEGATIVE   Ketones, ur NEGATIVE NEGATIVE mg/dL   Protein, ur NEGATIVE NEGATIVE mg/dL   Nitrite NEGATIVE NEGATIVE   Leukocytes,Ua NEGATIVE NEGATIVE  Troponin I (High Sensitivity)     Status: None   Collection Time: 09/10/19  1:06 AM  Result Value Ref Range   Troponin I (High Sensitivity) 10 <18 ng/L    Imaging Studies: DG Chest 2 View  Result Date: 09/09/2019 CLINICAL DATA:  Near syncope. EXAM: CHEST - 2 VIEW COMPARISON:  Radiograph 05/29/2016 FINDINGS: Dual lead left-sided pacemaker remains in place. Unchanged heart size and mediastinal contours. Mild chronic elevation of right hemidiaphragm. No focal airspace disease, pulmonary edema, pleural effusion or pneumothorax. No acute osseous abnormalities are seen. IMPRESSION: No acute chest findings. Electronically Signed   By: Ivette Loyal.D.  On: 09/09/2019 23:14    1:44 AM Troponins negative x2.  Patient without complaint, states he is ready to go home.   Leevi Cullars, Jenny Reichmann, MD 09/10/19 0145

## 2021-01-19 ENCOUNTER — Other Ambulatory Visit: Payer: Self-pay

## 2021-01-19 ENCOUNTER — Encounter (HOSPITAL_BASED_OUTPATIENT_CLINIC_OR_DEPARTMENT_OTHER): Payer: Self-pay

## 2021-01-19 ENCOUNTER — Observation Stay (HOSPITAL_BASED_OUTPATIENT_CLINIC_OR_DEPARTMENT_OTHER)
Admission: EM | Admit: 2021-01-19 | Discharge: 2021-01-21 | Disposition: A | Discharge status: Home / Self Care | Payer: No Typology Code available for payment source | Attending: Internal Medicine | Admitting: Internal Medicine

## 2021-01-19 DIAGNOSIS — E039 Hypothyroidism, unspecified: Secondary | ICD-10-CM | POA: Insufficient documentation

## 2021-01-19 DIAGNOSIS — K920 Hematemesis: Secondary | ICD-10-CM | POA: Diagnosis present

## 2021-01-19 DIAGNOSIS — Z7984 Long term (current) use of oral hypoglycemic drugs: Secondary | ICD-10-CM | POA: Diagnosis not present

## 2021-01-19 DIAGNOSIS — Z7902 Long term (current) use of antithrombotics/antiplatelets: Secondary | ICD-10-CM | POA: Diagnosis not present

## 2021-01-19 DIAGNOSIS — Z79899 Other long term (current) drug therapy: Secondary | ICD-10-CM | POA: Insufficient documentation

## 2021-01-19 DIAGNOSIS — E119 Type 2 diabetes mellitus without complications: Secondary | ICD-10-CM | POA: Insufficient documentation

## 2021-01-19 DIAGNOSIS — K922 Gastrointestinal hemorrhage, unspecified: Secondary | ICD-10-CM | POA: Diagnosis not present

## 2021-01-19 DIAGNOSIS — Z20822 Contact with and (suspected) exposure to covid-19: Secondary | ICD-10-CM | POA: Insufficient documentation

## 2021-01-19 DIAGNOSIS — I10 Essential (primary) hypertension: Secondary | ICD-10-CM | POA: Insufficient documentation

## 2021-01-19 DIAGNOSIS — Z950 Presence of cardiac pacemaker: Secondary | ICD-10-CM | POA: Insufficient documentation

## 2021-01-19 LAB — RESP PANEL BY RT-PCR (FLU A&B, COVID) ARPGX2
Influenza A by PCR: NEGATIVE
Influenza B by PCR: NEGATIVE
SARS Coronavirus 2 by RT PCR: NEGATIVE

## 2021-01-19 LAB — CBC WITH DIFFERENTIAL/PLATELET
Abs Immature Granulocytes: 0.02 10*3/uL (ref 0.00–0.07)
Basophils Absolute: 0 10*3/uL (ref 0.0–0.1)
Basophils Relative: 0 %
Eosinophils Absolute: 0.1 10*3/uL (ref 0.0–0.5)
Eosinophils Relative: 1 %
HCT: 34.1 % — ABNORMAL LOW (ref 39.0–52.0)
Hemoglobin: 11 g/dL — ABNORMAL LOW (ref 13.0–17.0)
Immature Granulocytes: 0 %
Lymphocytes Relative: 14 %
Lymphs Abs: 1 10*3/uL (ref 0.7–4.0)
MCH: 30.3 pg (ref 26.0–34.0)
MCHC: 32.3 g/dL (ref 30.0–36.0)
MCV: 93.9 fL (ref 80.0–100.0)
Monocytes Absolute: 0.6 10*3/uL (ref 0.1–1.0)
Monocytes Relative: 9 %
Neutro Abs: 5.5 10*3/uL (ref 1.7–7.7)
Neutrophils Relative %: 76 %
Platelets: 155 10*3/uL (ref 150–400)
RBC: 3.63 MIL/uL — ABNORMAL LOW (ref 4.22–5.81)
RDW: 14.3 % (ref 11.5–15.5)
WBC: 7.3 10*3/uL (ref 4.0–10.5)
nRBC: 0 % (ref 0.0–0.2)

## 2021-01-19 LAB — COMPREHENSIVE METABOLIC PANEL
ALT: 15 U/L (ref 0–44)
AST: 24 U/L (ref 15–41)
Albumin: 3.3 g/dL — ABNORMAL LOW (ref 3.5–5.0)
Alkaline Phosphatase: 58 U/L (ref 38–126)
Anion gap: 7 (ref 5–15)
BUN: 21 mg/dL (ref 8–23)
CO2: 26 mmol/L (ref 22–32)
Calcium: 8.3 mg/dL — ABNORMAL LOW (ref 8.9–10.3)
Chloride: 104 mmol/L (ref 98–111)
Creatinine, Ser: 1.26 mg/dL — ABNORMAL HIGH (ref 0.61–1.24)
GFR, Estimated: 59 mL/min — ABNORMAL LOW (ref >60)
Glucose, Bld: 205 mg/dL — ABNORMAL HIGH (ref 70–99)
Potassium: 4.4 mmol/L (ref 3.5–5.1)
Sodium: 137 mmol/L (ref 135–145)
Total Bilirubin: 0.6 mg/dL (ref 0.3–1.2)
Total Protein: 5.9 g/dL — ABNORMAL LOW (ref 6.5–8.1)

## 2021-01-19 MED ORDER — PANTOPRAZOLE SODIUM 40 MG IV SOLR
40.0000 mg | Freq: Once | INTRAVENOUS | Status: AC
  Administered 2021-01-19: 40 mg via INTRAVENOUS
  Filled 2021-01-19: qty 40

## 2021-01-19 NOTE — ED Triage Notes (Signed)
Per pt and wife-[facility] blood ~1hour PTA-NAD-to triage in w/c

## 2021-01-19 NOTE — ED Notes (Addendum)
First contact with patient. Patient arrived via triage with wife with complaints of vomiting blood x 1 today after having an endoscopy at the [place] today. Pt is A&OX 4. Respirations even/unlabored -Patient denies abdominal pain - no new distention noted. Patient denies irregular BM. Patient changed into gown and placed on cardiac monitor and call light within reach. Patient updated on plan of care. Will continue to monitor patient.   2139: Patient given warm blankets for comfort. No acute distress noted. Wife remains at bedside.   2303: Report given to [facility] for continuation of care. No acute distress noted upon this RN's departure of Patient.

## 2021-01-19 NOTE — ED Provider Notes (Signed)
[facility] EMERGENCY DEPARTMENT Provider Note   CSN: [id number] Arrival date & time: 01/19/21  1850     History Chief Complaint  Patient presents with   Hematemesis    [facility] is a 75 y.o. male.  Pt had an endoscopy today at the [facility] hospital.  Pt had his esophagus stretched and had a 76mm gastric polyp removed.  Pt vomitted blood at home after returning home.  Pt's family member called the [place] hospital and was told to come here.    The history is provided by the patient. No language interpreter was used.  Emesis Severity:  Moderate Timing:  Constant Number of daily episodes:  1 Quality:  Bright red blood Progression:  Worsening Chronicity:  New Relieved by:  Nothing Worsened by:  Nothing Ineffective treatments:  None tried Associated symptoms: no abdominal pain   Risk factors: no alcohol use and no prior abdominal surgery       Past Medical History:  Diagnosis Date   Diabetes mellitus without complication (HCC)    GI bleed    High cholesterol    Hypertension    Thyroid disease     Patient Active Problem List   Diagnosis Date Noted   Hematemesis 01/19/2021    Past Surgical History:  Procedure Laterality Date   PACEMAKER INSERTION         No family history on file.  Social History   Tobacco Use   Smoking status: Never   Smokeless tobacco: Never  Vaping Use   Vaping Use: Never used  Substance Use Topics   Alcohol use: No   Drug use: No    Home Medications Prior to Admission medications   Medication Sig Start Date End Date Taking? Authorizing Provider  AMIODARONE HCL PO Take by mouth.    [provider]  ATORVASTATIN CALCIUM PO Take by mouth.    [provider]  carvedilol (COREG) 25 MG tablet Take 25 mg by mouth 2 (two) times daily with a meal.    [provider]  clopidogrel (PLAVIX) 75 MG tablet Take 75 mg by mouth daily.    [provider]  glipiZIDE (GLUCOTROL) 10 MG tablet Take 10 mg by  mouth daily before breakfast.    [provider]  LEVOTHYROXINE SODIUM PO Take by mouth.    [provider]  lisinopril (PRINIVIL,ZESTRIL) 10 MG tablet Take 10 mg by mouth daily.    [provider]  cetirizine (ZYRTEC ALLERGY) 10 MG tablet Take 1 tablet (10 mg total) by mouth daily. 07/21/14 09/10/19  Palumbo, April, MD    Allergies    Patient has no known allergies.  Review of Systems   Review of Systems  Gastrointestinal:  Positive for vomiting. Negative for abdominal pain.  All other systems reviewed and are negative.  Physical Exam Updated Vital Signs BP 106/66   Pulse 65   Temp 97.9 F (36.6 C) (Oral)   Resp 15   Ht 6\' 1"  (1.854 m)   Wt 94.8 kg   SpO2 97%   BMI 27.57 kg/m   Physical Exam Vitals and nursing note reviewed.  Constitutional:      Appearance: He is well-developed.  HENT:     Head: Normocephalic and atraumatic.  Eyes:     Conjunctiva/sclera: Conjunctivae normal.  Cardiovascular:     Rate and Rhythm: Normal rate and regular rhythm.     Heart sounds: No murmur heard. Pulmonary:     Effort: Pulmonary effort is normal. No respiratory  distress.     Breath sounds: Normal breath sounds.  Abdominal:     Palpations: Abdomen is soft.     Tenderness: There is no abdominal tenderness.  Musculoskeletal:        General: Normal range of motion.     Cervical back: Neck supple.  Skin:    General: Skin is warm and dry.  Neurological:     General: No focal deficit present.     Mental Status: He is alert.    ED Results / Procedures / Treatments   Labs (all labs ordered are listed, but only abnormal results are displayed) Labs Reviewed  CBC WITH DIFFERENTIAL/PLATELET - Abnormal; Notable for the following components:      Result Value   RBC 3.63 (*)    Hemoglobin 11.0 (*)    HCT 34.1 (*)    All other components within normal limits  COMPREHENSIVE METABOLIC PANEL - Abnormal; Notable for the following components:   Glucose, Bld 205 (*)     Creatinine, Ser 1.26 (*)    Calcium 8.3 (*)    Total Protein 5.9 (*)    Albumin 3.3 (*)    GFR, Estimated 59 (*)    All other components within normal limits  RESP PANEL BY RT-PCR (FLU A&B, COVID) ARPGX2    EKG None  Radiology No results found.  Procedures Procedures   Medications Ordered in ED Medications  pantoprazole (PROTONIX) injection 40 mg (40 mg Intravenous Given 01/19/21 2138)    ED Course  I have reviewed the triage vital signs and the nursing notes.  Pertinent labs & imaging results that were available during my care of the patient were reviewed by me and considered in my medical decision making (see chart for details).    MDM Rules/Calculators/A&P                           MDM:  Pt given protonix iv.  Hemoglobin 11.  I spoke with Dr. Bosie Clos who advised Hospitalist admission for observation.  He will consult if needed/in am. I spoke with Dr. Joneen Roach  Hospitalist who will admit.   Final Clinical Impression(s) / ED Diagnoses Final diagnoses:  Acute upper GI bleeding    Rx / DC Orders ED Discharge Orders     None        Osie Cheeks 01/19/21 2234    Charlynne Pander, MD 01/19/21 [phone]

## 2021-01-20 ENCOUNTER — Encounter (HOSPITAL_COMMUNITY): Payer: Self-pay | Admitting: Internal Medicine

## 2021-01-20 DIAGNOSIS — K922 Gastrointestinal hemorrhage, unspecified: Secondary | ICD-10-CM

## 2021-01-20 DIAGNOSIS — Z79899 Other long term (current) drug therapy: Secondary | ICD-10-CM | POA: Diagnosis not present

## 2021-01-20 DIAGNOSIS — Z20822 Contact with and (suspected) exposure to covid-19: Secondary | ICD-10-CM | POA: Diagnosis not present

## 2021-01-20 DIAGNOSIS — Z950 Presence of cardiac pacemaker: Secondary | ICD-10-CM | POA: Diagnosis not present

## 2021-01-20 DIAGNOSIS — Z7902 Long term (current) use of antithrombotics/antiplatelets: Secondary | ICD-10-CM | POA: Diagnosis not present

## 2021-01-20 DIAGNOSIS — Z7984 Long term (current) use of oral hypoglycemic drugs: Secondary | ICD-10-CM | POA: Diagnosis not present

## 2021-01-20 DIAGNOSIS — I10 Essential (primary) hypertension: Secondary | ICD-10-CM | POA: Diagnosis not present

## 2021-01-20 DIAGNOSIS — E119 Type 2 diabetes mellitus without complications: Secondary | ICD-10-CM | POA: Diagnosis not present

## 2021-01-20 DIAGNOSIS — K920 Hematemesis: Secondary | ICD-10-CM | POA: Diagnosis present

## 2021-01-20 DIAGNOSIS — E039 Hypothyroidism, unspecified: Secondary | ICD-10-CM | POA: Diagnosis not present

## 2021-01-20 LAB — CBC WITH DIFFERENTIAL/PLATELET
Abs Immature Granulocytes: 0.03 10*3/uL (ref 0.00–0.07)
Abs Immature Granulocytes: 0.02 10*3/uL (ref 0.00–0.07)
Basophils Absolute: 0 10*3/uL (ref 0.0–0.1)
Basophils Absolute: 0 10*3/uL (ref 0.0–0.1)
Basophils Relative: 0 %
Basophils Relative: 0 %
Eosinophils Absolute: 0.1 10*3/uL (ref 0.0–0.5)
Eosinophils Absolute: 0.1 10*3/uL (ref 0.0–0.5)
Eosinophils Relative: 1 %
Eosinophils Relative: 1 %
HCT: 32.9 % — ABNORMAL LOW (ref 39.0–52.0)
HCT: 32.7 % — ABNORMAL LOW (ref 39.0–52.0)
Hemoglobin: 10.6 g/dL — ABNORMAL LOW (ref 13.0–17.0)
Hemoglobin: 10.5 g/dL — ABNORMAL LOW (ref 13.0–17.0)
Immature Granulocytes: 0 %
Immature Granulocytes: 0 %
Lymphocytes Relative: 18 %
Lymphocytes Relative: 23 %
Lymphs Abs: 1.6 10*3/uL (ref 0.7–4.0)
Lymphs Abs: 1.6 10*3/uL (ref 0.7–4.0)
MCH: 29.9 pg (ref 26.0–34.0)
MCH: 30 pg (ref 26.0–34.0)
MCHC: 32.2 g/dL (ref 30.0–36.0)
MCHC: 32.1 g/dL (ref 30.0–36.0)
MCV: 92.9 fL (ref 80.0–100.0)
MCV: 93.4 fL (ref 80.0–100.0)
Monocytes Absolute: 1 10*3/uL (ref 0.1–1.0)
Monocytes Absolute: 0.7 10*3/uL (ref 0.1–1.0)
Monocytes Relative: 11 %
Monocytes Relative: 10 %
Neutro Abs: 6.3 10*3/uL (ref 1.7–7.7)
Neutro Abs: 4.5 10*3/uL (ref 1.7–7.7)
Neutrophils Relative %: 70 %
Neutrophils Relative %: 66 %
Platelets: 174 10*3/uL (ref 150–400)
Platelets: 176 10*3/uL (ref 150–400)
RBC: 3.54 MIL/uL — ABNORMAL LOW (ref 4.22–5.81)
RBC: 3.5 MIL/uL — ABNORMAL LOW (ref 4.22–5.81)
RDW: 14.4 % (ref 11.5–15.5)
RDW: 14.6 % (ref 11.5–15.5)
WBC: 9.1 10*3/uL (ref 4.0–10.5)
WBC: 6.9 10*3/uL (ref 4.0–10.5)
nRBC: 0 % (ref 0.0–0.2)
nRBC: 0 % (ref 0.0–0.2)

## 2021-01-20 LAB — GLUCOSE, CAPILLARY
Glucose-Capillary: 63 mg/dL — ABNORMAL LOW (ref 70–99)
Glucose-Capillary: 94 mg/dL (ref 70–99)

## 2021-01-20 MED ORDER — ACETAMINOPHEN 325 MG PO TABS: 650.0000 mg | ORAL_TABLET | Freq: Four times a day (QID) | ORAL | Status: DC | PRN

## 2021-01-20 MED ORDER — INSULIN ASPART 100 UNIT/ML IJ SOLN
0.0000 [IU] | Freq: Three times a day (TID) | INTRAMUSCULAR | Status: DC
Start: 2021-01-20 — End: 2021-01-21

## 2021-01-20 MED ORDER — ONDANSETRON HCL 4 MG PO TABS: 4.0000 mg | ORAL_TABLET | Freq: Four times a day (QID) | ORAL | Status: DC | PRN

## 2021-01-20 MED ORDER — ONDANSETRON HCL 4 MG/2ML IJ SOLN: 4.0000 mg | Freq: Four times a day (QID) | INTRAMUSCULAR | Status: DC | PRN

## 2021-01-20 MED ORDER — ACETAMINOPHEN 650 MG RE SUPP: 650.0000 mg | Freq: Four times a day (QID) | RECTAL | Status: DC | PRN

## 2021-01-20 MED ORDER — PANTOPRAZOLE SODIUM 40 MG IV SOLR
40.0000 mg | Freq: Two times a day (BID) | INTRAVENOUS | Status: DC
  Administered 2021-01-20 – 2021-01-21 (×2): 40 mg via INTRAVENOUS
  Filled 2021-01-20 (×2): qty 40

## 2021-01-20 NOTE — ED Notes (Signed)
Report called to 4W charge RN, Report given to [facility].

## 2021-01-20 NOTE — ED Notes (Signed)
Attempted to call [facility] 4W nurses station, no answer.

## 2021-01-20 NOTE — ED Notes (Signed)
No complaints at this time.

## 2021-01-20 NOTE — Plan of Care (Signed)
  Problem: Education: Goal: Knowledge of General Education information will improve Description: Including pain rating scale, medication(s)/side effects and non-pharmacologic comfort measures Outcome: Progressing   Problem: Clinical Measurements: Goal: Ability to maintain clinical measurements within normal limits will improve Outcome: Progressing Goal: Respiratory complications will improve Outcome: Progressing   

## 2021-01-20 NOTE — ED Notes (Signed)
Pt had dark red diarrhea x2. Dr. Eudelia Bunch notified.

## 2021-01-20 NOTE — H&P (Signed)
History and Physical    Fields Oros CVE:[id number] DOB: 06/28/1945 DOA: 01/19/2021  I have briefly reviewed the patient's prior medical records in [facility]  [facility]  Patient coming from: home  Chief Complaint: Vomited blood  HPI: [facility] is a 75 y.o. male with medical history significant of dysphagia, hypertension, DM2, pacemaker, hyperlipidemia, hypothyroidism, comes to the hospital with complaints of vomiting blood.  Patient had problems with dysphagia, mild reflux and coughing after eating, underwent an EGD at the [place] on 10/25.  I saw the EGD report, he appears to have had his esophagus dilated, and also had a gastric polyp which was removed.  He was told not to eat for few hours after the endoscopy however once he was done went to a restaurant and ate normally.  He got sick afterwards and started having vomiting with an episode of large hematemesis as described per the patient.  He never had any problems since.  He denies any chest pain, denies any shortness of breath.  No abdominal pain, no current nausea or vomiting.  ED Course: In the emergency room he is afebrile, blood pressure is normal, satting well on room air.  Blood work shows a creatinine of 1.2, hemoglobin was initially 11.0 and now 10.5.  COVID was negative.  He was admitted to the hospital for observation.  Review of Systems: All systems reviewed, and apart from HPI, all negative  Past Medical History:  Diagnosis Date   Diabetes mellitus without complication (HCC)    GI bleed    High cholesterol    Hypertension    Thyroid disease     Past Surgical History:  Procedure Laterality Date   PACEMAKER INSERTION       reports that he has never smoked. He has never used smokeless tobacco. He reports that he does not drink alcohol and does not use drugs.  No Known Allergies  Family history reviewed and noncontributory  Prior to Admission medications   Medication Sig Start Date End Date  Taking? Authorizing Provider  AMIODARONE HCL PO Take by mouth.    [provider]  ATORVASTATIN CALCIUM PO Take by mouth.    [provider]  carvedilol (COREG) 25 MG tablet Take 25 mg by mouth 2 (two) times daily with a meal.    [provider]  clopidogrel (PLAVIX) 75 MG tablet Take 75 mg by mouth daily.    [provider]  glipiZIDE (GLUCOTROL) 10 MG tablet Take 10 mg by mouth daily before breakfast.    [provider]  LEVOTHYROXINE SODIUM PO Take by mouth.    [provider]  lisinopril (PRINIVIL,ZESTRIL) 10 MG tablet Take 10 mg by mouth daily.    [provider]  cetirizine (ZYRTEC ALLERGY) 10 MG tablet Take 1 tablet (10 mg total) by mouth daily. 07/21/14 09/10/19  Palumbo, April, MD    Physical Exam: Vitals:   01/20/21 1230 01/20/21 1330 01/20/21 1500 01/20/21 1630  BP: (!) 92/59 110/74 127/73 133/77  Pulse: 68 62 65 66  Resp:  15 16 14   Temp:      TempSrc:      SpO2: 98% 94% 97% 96%  Weight:      Height:        Constitutional: NAD, calm, comfortable Eyes: PERRL, lids and conjunctivae normal ENMT: Mucous membranes are moist. Posterior pharynx clear of any exudate or lesions.Normal dentition.  Neck: normal, supple Respiratory: clear to auscultation bilaterally, no wheezing, no crackles. Normal respiratory  effort. No accessory muscle use.  Cardiovascular: Regular rate and rhythm, no murmurs / rubs / gallops. No extremity edema. 2+ pedal pulses.  Abdomen: no tenderness, no masses palpated. Bowel sounds positive.  Musculoskeletal: no clubbing / cyanosis. Normal muscle tone.  Skin: no rashes, lesions, ulcers. No induration Neurologic: CN 2-12 grossly intact. Strength 5/5 in all 4.  Psychiatric: Normal judgment and insight. Alert and oriented x 3. Normal mood.   Labs on Admission: I have personally reviewed following labs and imaging studies  CBC: Recent Labs  Lab 01/19/21 1927 01/20/21 0426 01/20/21 1430  WBC  7.3 9.1 6.9  NEUTROABS 5.5 6.3 4.5  HGB 11.0* 10.6* 10.5*  HCT 34.1* 32.9* 32.7*  MCV 93.9 92.9 93.4  PLT 155 174 176   Basic Metabolic Panel: Recent Labs  Lab 01/19/21 1927  NA 137  K 4.4  CL 104  CO2 26  GLUCOSE 205*  BUN 21  CREATININE 1.26*  CALCIUM 8.3*   Liver Function Tests: Recent Labs  Lab 01/19/21 1927  AST 24  ALT 15  ALKPHOS 58  BILITOT 0.6  PROT 5.9*  ALBUMIN 3.3*   Coagulation Profile: No results for input(s): INR, PROTIME in the last 168 hours. BNP (last 3 results) No results for input(s): PROBNP in the last 8760 hours. CBG: No results for input(s): GLUCAP in the last 168 hours. Thyroid Function Tests: No results for input(s): TSH, T4TOTAL, FREET4, T3FREE, THYROIDAB in the last 72 hours. Urine analysis:    Component Value Date/Time   COLORURINE YELLOW 09/10/2019 0023   APPEARANCEUR CLEAR 09/10/2019 0023   LABSPEC 1.025 09/10/2019 0023   PHURINE 5.5 09/10/2019 0023   GLUCOSEU NEGATIVE 09/10/2019 0023   HGBUR NEGATIVE 09/10/2019 0023   BILIRUBINUR NEGATIVE 09/10/2019 0023   KETONESUR NEGATIVE 09/10/2019 0023   PROTEINUR NEGATIVE 09/10/2019 0023   NITRITE NEGATIVE 09/10/2019 0023   LEUKOCYTESUR NEGATIVE 09/10/2019 0023     Radiological Exams on Admission: No results found.  Assessment/Plan  Principal Problem Hematemesis-this is immediately postop her endoscopy when a polyp was removed.  He also had his esophagus stretched.  He is now feeling back to baseline, no further vomiting episodes -Observed in the hospital, check hemoglobin in the morning.  For now placed on a clear liquid diet, PPI, n.p.o. after midnight in case he has recurrent episodes. -Plavix is listed on his home medication but he is not sure if he is taking this or not  Active Problems Type 2 diabetes mellitus-hold glipizide at home medications have not been reconciled and he does not know what he is taking.  Placed on sliding scale  Pacemaker in place-unclear medical  condition and he does not know.  He does not know whether he takes amiodarone are not.  Hold until medication reconciliation is done  Mildly elevated creatinine-unclear baseline, but do anticipate this is probably his baseline  Hypothyroidism-await medication reconciliation  Essential hypertension-his blood pressure is normal, hold Coreg, lisinopril, medication reconciliation not done  DVT prophylaxis: SCDs  Code Status: Full code  Family Communication: wife at bedside  Disposition Plan: home when ready  Bed Type: telemetry  Consults called: messaged GI, Dr Dulce Sellar Obs/Inp: Obs  Pamella Pert, MD, PhD [facility]  Contact via [email or web address]  01/20/2021, 5:58 PM

## 2021-01-21 DIAGNOSIS — K922 Gastrointestinal hemorrhage, unspecified: Secondary | ICD-10-CM | POA: Diagnosis not present

## 2021-01-21 DIAGNOSIS — K920 Hematemesis: Secondary | ICD-10-CM | POA: Diagnosis not present

## 2021-01-21 LAB — GLUCOSE, CAPILLARY
Glucose-Capillary: 92 mg/dL (ref 70–99)
Glucose-Capillary: 122 mg/dL — ABNORMAL HIGH (ref 70–99)
Glucose-Capillary: 113 mg/dL — ABNORMAL HIGH (ref 70–99)

## 2021-01-21 LAB — CBC
HCT: 31.6 % — ABNORMAL LOW (ref 39.0–52.0)
Hemoglobin: 9.9 g/dL — ABNORMAL LOW (ref 13.0–17.0)
MCH: 30.1 pg (ref 26.0–34.0)
MCHC: 31.3 g/dL (ref 30.0–36.0)
MCV: 96 fL (ref 80.0–100.0)
Platelets: 150 10*3/uL (ref 150–400)
RBC: 3.29 MIL/uL — ABNORMAL LOW (ref 4.22–5.81)
RDW: 14.7 % (ref 11.5–15.5)
WBC: 5.6 10*3/uL (ref 4.0–10.5)
nRBC: 0 % (ref 0.0–0.2)

## 2021-01-21 LAB — COMPREHENSIVE METABOLIC PANEL
ALT: 13 U/L (ref 0–44)
AST: 16 U/L (ref 15–41)
Albumin: 3.3 g/dL — ABNORMAL LOW (ref 3.5–5.0)
Alkaline Phosphatase: 56 U/L (ref 38–126)
Anion gap: 5 (ref 5–15)
BUN: 28 mg/dL — ABNORMAL HIGH (ref 8–23)
CO2: 26 mmol/L (ref 22–32)
Calcium: 8.6 mg/dL — ABNORMAL LOW (ref 8.9–10.3)
Chloride: 109 mmol/L (ref 98–111)
Creatinine, Ser: 1.15 mg/dL (ref 0.61–1.24)
GFR, Estimated: >60 mL/min (ref >60)
Glucose, Bld: 87 mg/dL (ref 70–99)
Potassium: 4 mmol/L (ref 3.5–5.1)
Sodium: 140 mmol/L (ref 135–145)
Total Bilirubin: 0.6 mg/dL (ref 0.3–1.2)
Total Protein: 5.8 g/dL — ABNORMAL LOW (ref 6.5–8.1)

## 2021-01-21 LAB — HEMOGLOBIN AND HEMATOCRIT, BLOOD
HCT: 31.6 % — ABNORMAL LOW (ref 39.0–52.0)
Hemoglobin: 10 g/dL — ABNORMAL LOW (ref 13.0–17.0)

## 2021-01-21 MED ORDER — ORAL CARE MOUTH RINSE: 15.0000 mL | Freq: Two times a day (BID) | OROMUCOSAL | Status: DC

## 2021-01-21 NOTE — Discharge Instructions (Signed)
Please do not take Plavix until you speak with [facility]. Please avoid NSAIDs and Aspirin

## 2021-01-21 NOTE — Progress Notes (Signed)
Patient's diet progressed to Soft per MD orders.  Patient ate 100% meal, no complaints of pain or difficulty swallowing.  Afterwards, patient had a dark brown/red Type 6/Type 7 bowel movement.  MD aware.  Bradd Burner, RN

## 2021-01-21 NOTE — Progress Notes (Signed)
Discharge instructions given to patient and reviewed.  Patient verbalized understanding. IV and telemetry box removed.  Patient escorted to main entrance with belongings via wheelchair for transport home with wife.  Bradd Burner, RN

## 2021-01-21 NOTE — Plan of Care (Signed)

## 2021-01-21 NOTE — Discharge Summary (Signed)
Physician Discharge Summary  Matthew Browning OEU:[id number] DOB: [birth year] DOA: 01/19/2021  PCP: [facility]  Admit date: 01/19/2021 Discharge date: 01/21/2021  Admitted From: home Disposition:  home  Recommendations for Outpatient Follow-up:  Follow up with PCP in 1-2 weeks Follow-up with [facility] gastroenterology in 1 week Hold Plavix for at least 5 more days, avoid aspirin/NSAIDs  Home Health: none Equipment/Devices: none  Discharge Condition: stable CODE STATUS: Full code Diet recommendation: regular  HPI: Per admitting MD, Matthew Browning is a 75 y.o. male with medical history significant of dysphagia, hypertension, DM2, pacemaker, hyperlipidemia, hypothyroidism, comes to the hospital with complaints of vomiting blood.  Patient had problems with dysphagia, mild reflux and coughing after eating, underwent an EGD at the [place] on 10/25.  I saw the EGD report, he appears to have had his esophagus dilated, and also had a gastric polyp which was removed.  He was told not to eat for few hours after the endoscopy however once he was done went to a restaurant and ate normally.  He got sick afterwards and started having vomiting with an episode of large hematemesis as described per the patient.  He never had any problems since.  He denies any chest pain, denies any shortness of breath.  No abdominal pain, no current nausea or vomiting.  Hospital Course / Discharge diagnoses: Principal Problem Hematemesis-this is immediately postop her endoscopy when a polyp was removed.  He also had his esophagus stretched.  He is now feeling back to baseline, no further vomiting episodes.  He was monitored in the hospital, he did have 1 episode of black stools which is likely residual blood from his original bleed.  His hemoglobin has remained stable, in fact trending upwards, he is tolerating a regular diet and will be discharged home in stable condition.  Case was discussed with Dr. Bosie Clos from  gastroenterology, [facility].  Patient is unclear about what medication he is taking at home, he is not sure if he takes Plavix, regardless he was advised to avoid that for the next 5 days as well as avoid aspirin/NSAIDs.  Continue PPI.   Active Problems Type 2 diabetes mellitus-resume home medications Pacemaker in place-unclear medical condition and he does not know.  He does not know whether he takes amiodarone are not.  Resume home medication as prior to his hospitalization Mildly elevated creatinine-stable, unclear baseline  Hypothyroidism-resume home medications Essential hypertension-resume home medications  Sepsis ruled out   Discharge Instructions   Allergies as of 01/21/2021   No Known Allergies      Medication List     STOP taking these medications    clopidogrel 75 MG tablet Commonly known as: PLAVIX       TAKE these medications    amiodarone 200 MG tablet Commonly known as: PACERONE Take 200 mg by mouth daily.   atorvastatin 40 MG tablet Commonly known as: LIPITOR Take 20 mg by mouth at bedtime.   carboxymethylcellulose 0.5 % Soln Commonly known as: REFRESH PLUS Place 1 drop into both eyes every 2 (two) hours.   carvedilol 25 MG tablet Commonly known as: COREG Take 12.5 mg by mouth 2 (two) times daily with a meal.   Cholecalciferol 50 MCG (2000 UT) Tabs Take 2,000 Units by mouth daily.   colchicine 0.6 MG tablet Take 0.6-1.2 mg by mouth See admin instructions. Take 1.2mg  once for 1 day, then take 0.6mg  once daily as needed for gout flare  cyclobenzaprine 10 MG tablet Commonly known as: FLEXERIL Take 5-10 mg by mouth 3 (three) times daily as needed for muscle spasms.   diclofenac Sodium 1 % Gel Commonly known as: VOLTAREN Apply 2 g topically 4 (four) times daily as needed (shoulder pain).   DSS 100 MG Caps Take 100 mg by mouth 2 (two) times daily as needed (hard stools).   Eye Lubricant  Oint Apply 1 application to eye at bedtime.   ferrous sulfate 325 (65 FE) MG tablet Take 325 mg by mouth daily with breakfast.   furosemide 40 MG tablet Commonly known as: LASIX Take 40 mg by mouth daily.   gabapentin 300 MG capsule Commonly known as: NEURONTIN Take 300 mg by mouth 3 (three) times daily as needed (pain).   glipiZIDE 10 MG tablet Commonly known as: GLUCOTROL Take 10 mg by mouth 2 (two) times daily before a meal.   levothyroxine 125 MCG tablet Commonly known as: SYNTHROID Take 125 mcg by mouth daily.   pantoprazole 40 MG tablet Commonly known as: PROTONIX Take 40 mg by mouth daily.   pioglitazone 30 MG tablet Commonly known as: ACTOS Take 15 mg by mouth daily.   potassium chloride SA 20 MEQ tablet Commonly known as: KLOR-CON Take 20 mEq by mouth daily.   sertraline 100 MG tablet Commonly known as: ZOLOFT Take 100 mg by mouth daily.   sildenafil 100 MG tablet Commonly known as: VIAGRA Take 50 mg by mouth daily as needed for erectile dysfunction.        Consultations: None  Procedures/Studies:  No results found.   Subjective: - no chest pain, shortness of breath, no abdominal pain, nausea or vomiting.   Discharge Exam: BP 111/66 (BP Location: Right Arm)   Pulse 63   Temp 98.1 F (36.7 C) (Oral)   Resp 18   Ht 6\' 1"  (1.854 m)   Wt 94.8 kg   SpO2 95%   BMI 27.57 kg/m   General: Pt is alert, awake, not in acute distress Cardiovascular: RRR, S1/S2 +, no rubs, no gallops Respiratory: CTA bilaterally, no wheezing, no rhonchi Abdominal: Soft, NT, ND, bowel sounds + Extremities: no edema, no cyanosis  The results of significant diagnostics from this hospitalization (including imaging, microbiology, ancillary and laboratory) are listed below for reference.     Microbiology: Recent Results (from the past 240 hour(s))  Resp Panel by RT-PCR (Flu A&B, Covid) Nasopharyngeal Swab     Status: None   Collection Time: 01/19/21  9:56 PM    Specimen: Nasopharyngeal Swab; Nasopharyngeal(NP) swabs in vial transport medium  Result Value Ref Range Status   SARS Coronavirus 2 by RT PCR NEGATIVE NEGATIVE Final    Comment: (NOTE) SARS-CoV-2 target nucleic acids are NOT DETECTED.  The SARS-CoV-2 RNA is generally detectable in upper respiratory specimens during the acute phase of infection. The lowest concentration of SARS-CoV-2 viral copies this assay can detect is 138 copies/mL. A negative result does not preclude SARS-Cov-2 infection and should not be used as the sole basis for treatment or other patient management decisions. A negative result may occur with  improper specimen collection/handling, submission of specimen other than nasopharyngeal swab, presence of viral mutation(s) within the areas targeted by this assay, and inadequate number of viral copies(<138 copies/mL). A negative result must be combined with clinical observations, patient history, and epidemiological information. The expected result is Negative.  Fact Sheet for Patients:  01/21/21  Fact Sheet for Healthcare Providers:  BloggerCourse.com  This test is no t  yet approved or cleared by the [place] and  has been authorized for detection and/or diagnosis of SARS-CoV-2 by FDA under an Emergency Use Authorization (EUA). This EUA will remain  in effect (meaning this test can be used) for the duration of the COVID-19 declaration under Section 564(b)(1) of the Act, 21 U.S.C.section 360bbb-3(b)(1), unless the authorization is terminated  or revoked sooner.       Influenza A by PCR NEGATIVE NEGATIVE Final   Influenza B by PCR NEGATIVE NEGATIVE Final    Comment: (NOTE) The Xpert Xpress SARS-CoV-2/FLU/RSV plus assay is intended as an aid in the diagnosis of influenza from Nasopharyngeal swab specimens and should not be used as a sole basis for treatment. Nasal washings and aspirates are  unacceptable for Xpert Xpress SARS-CoV-2/FLU/RSV testing.  Fact Sheet for Patients: BloggerCourse.com  Fact Sheet for Healthcare Providers: SeriousBroker.it  This test is not yet approved or cleared by the [place] FDA and has been authorized for detection and/or diagnosis of SARS-CoV-2 by FDA under an Emergency Use Authorization (EUA). This EUA will remain in effect (meaning this test can be used) for the duration of the COVID-19 declaration under Section 564(b)(1) of the Act, 21 U.S.C. section 360bbb-3(b)(1), unless the authorization is terminated or revoked.  Performed at [facility], [address] [place], [place] [address]      Labs: Basic Metabolic Panel: Recent Labs  Lab 01/19/21 1927 01/21/21 0358  NA 137 140  K 4.4 4.0  CL 104 109  CO2 26 26  GLUCOSE 205* 87  BUN 21 28*  CREATININE 1.26* 1.15  CALCIUM 8.3* 8.6*   Liver Function Tests: Recent Labs  Lab 01/19/21 1927 01/21/21 0358  AST 24 16  ALT 15 13  ALKPHOS 58 56  BILITOT 0.6 0.6  PROT 5.9* 5.8*  ALBUMIN 3.3* 3.3*   CBC: Recent Labs  Lab 01/19/21 1927 01/20/21 0426 01/20/21 1430 01/21/21 0358 01/21/21 1239  WBC 7.3 9.1 6.9 5.6  --   NEUTROABS 5.5 6.3 4.5  --   --   HGB 11.0* 10.6* 10.5* 9.9* 10.0*  HCT 34.1* 32.9* 32.7* 31.6* 31.6*  MCV 93.9 92.9 93.4 96.0  --   PLT 155 174 176 150  --    CBG: Recent Labs  Lab 01/20/21 1829 01/20/21 2202 01/21/21 0741 01/21/21 1133  GLUCAP 63* 94 92 122*   Hgb A1c No results for input(s): HGBA1C in the last 72 hours. Lipid Profile No results for input(s): CHOL, HDL, LDLCALC, TRIG, CHOLHDL, LDLDIRECT in the last 72 hours. Thyroid function studies No results for input(s): TSH, T4TOTAL, T3FREE, THYROIDAB in the last 72 hours.  Invalid input(s): FREET3 Urinalysis    Component Value Date/Time   COLORURINE YELLOW 09/10/2019 0023   APPEARANCEUR CLEAR 09/10/2019 0023   LABSPEC  1.025 09/10/2019 0023   PHURINE 5.5 09/10/2019 0023   GLUCOSEU NEGATIVE 09/10/2019 0023   HGBUR NEGATIVE 09/10/2019 0023   BILIRUBINUR NEGATIVE 09/10/2019 0023   KETONESUR NEGATIVE 09/10/2019 0023   PROTEINUR NEGATIVE 09/10/2019 0023   NITRITE NEGATIVE 09/10/2019 0023   LEUKOCYTESUR NEGATIVE 09/10/2019 0023    FURTHER DISCHARGE INSTRUCTIONS:   Get Medicines reviewed and adjusted: Please take all your medications with you for your next visit with your Primary MD   Laboratory/radiological data: Please request your Primary MD to go over all hospital tests and procedure/radiological results at the follow up, please ask your Primary MD to get all Hospital records sent to his/her office.   In  some cases, they will be blood work, cultures and biopsy results pending at the time of your discharge. Please request that your primary care M.D. goes through all the records of your hospital data and follows up on these results.   Also Note the following: If you experience worsening of your admission symptoms, develop shortness of breath, life threatening emergency, suicidal or homicidal thoughts you must seek medical attention immediately by calling 911 or calling your MD immediately  if symptoms less severe.   You must read complete instructions/literature along with all the possible adverse reactions/side effects for all the Medicines you take and that have been prescribed to you. Take any new Medicines after you have completely understood and accpet all the possible adverse reactions/side effects.    Do not drive when taking Pain medications or sleeping medications (Benzodaizepines)   Do not take more than prescribed Pain, Sleep and Anxiety Medications. It is not advisable to combine anxiety,sleep and pain medications without talking with your primary care practitioner   Special Instructions: If you have smoked or chewed Tobacco  in the last 2 yrs please stop smoking, stop any regular Alcohol  and  or any Recreational drug use.   Wear Seat belts while driving.   Please note: You were cared for by a hospitalist during your hospital stay. Once you are discharged, your primary care physician will handle any further medical issues. Please note that NO REFILLS for any discharge medications will be authorized once you are discharged, as it is imperative that you return to your primary care physician (or establish a relationship with a primary care physician if you do not have one) for your post hospital discharge needs so that they can reassess your need for medications and monitor your lab values.  Time coordinating discharge: 40 minutes  SIGNED:  Pamella Pert, MD, PhD 01/21/2021, 1:26 PM

## 2021-09-13 ENCOUNTER — Encounter (HOSPITAL_BASED_OUTPATIENT_CLINIC_OR_DEPARTMENT_OTHER): Payer: Self-pay | Admitting: Emergency Medicine

## 2021-09-13 ENCOUNTER — Emergency Department (HOSPITAL_BASED_OUTPATIENT_CLINIC_OR_DEPARTMENT_OTHER): Payer: No Typology Code available for payment source

## 2021-09-13 ENCOUNTER — Emergency Department (HOSPITAL_BASED_OUTPATIENT_CLINIC_OR_DEPARTMENT_OTHER)
Admission: EM | Admit: 2021-09-13 | Discharge: 2021-09-14 | Disposition: A | Discharge status: Home / Self Care | Payer: No Typology Code available for payment source | Attending: Emergency Medicine | Admitting: Emergency Medicine

## 2021-09-13 ENCOUNTER — Other Ambulatory Visit: Payer: Self-pay

## 2021-09-13 DIAGNOSIS — M7989 Other specified soft tissue disorders: Secondary | ICD-10-CM | POA: Diagnosis not present

## 2021-09-13 DIAGNOSIS — R778 Other specified abnormalities of plasma proteins: Secondary | ICD-10-CM | POA: Insufficient documentation

## 2021-09-13 DIAGNOSIS — K5900 Constipation, unspecified: Secondary | ICD-10-CM | POA: Insufficient documentation

## 2021-09-13 DIAGNOSIS — R55 Syncope and collapse: Secondary | ICD-10-CM | POA: Insufficient documentation

## 2021-09-13 NOTE — ED Triage Notes (Addendum)
Pt states he was staining to have a BM tonight and started feeling like he was having palpitations  Pt has a pacemaker  Pt states he felt short of breath and was sweating    Pt states he is feeling better now    Pt denies chest pain at any point

## 2021-09-13 NOTE — ED Provider Notes (Signed)
[facility] EMERGENCY DEPARTMENT Provider Note   CSN: [id number] Arrival date & time: 09/13/21  2216     History {Add pertinent medical, surgical, social history, OB history to HPI:1} Chief Complaint  Patient presents with   Chest Pain    Matthew Browning is a 77 y.o. male.  The history is provided by the patient, the spouse and medical records.  Chest Pain Matthew Browning is a 76 y.o. male who presents to the Emergency Department complaining of felt bad.  He presents to the emergency department accompanied by his wife for evaluation of an episode where he felt very poorly.  He was straining to have a bowel movement around 9:30 PM when he started to feel hot for about 15 minutes.  He was breathing hard and sweating during this episode.  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  He has no associated chest pain.  He has abdominal fullness and constipation, which is a chronic issue.  He was able to have a very small bowel movement this evening.  He also reports a few days of swelling to the right lower extremity and he started medications for gout.  He is unsure what these medications are.  At time of ED evaluation patient is asymptomatic.  No associated fever, cough, nausea, vomiting, dysuria.  Home Medications Prior to Admission medications   Medication Sig Start Date End Date Taking? Authorizing Provider  amiodarone (PACERONE) 200 MG tablet Take 200 mg by mouth daily.    [provider]  atorvastatin (LIPITOR) 40 MG tablet Take 20 mg by mouth at bedtime.    [provider]  carboxymethylcellulose (REFRESH PLUS) 0.5 % SOLN Place 1 drop into both eyes every 2 (two) hours. 11/20/20   [provider]  carvedilol (COREG) 25 MG tablet Take 12.5 mg by mouth 2 (two) times daily with a meal.    [provider]  Cholecalciferol 50 MCG (2000 UT) TABS Take 2,000 Units by mouth daily. 08/06/20   [provider]   colchicine 0.6 MG tablet Take 0.6-1.2 mg by mouth See admin instructions. Take 1.2mg  once for 1 day, then take 0.6mg  once daily as needed for gout flare 11/13/20   [provider]  cyclobenzaprine (FLEXERIL) 10 MG tablet Take 5-10 mg by mouth 3 (three) times daily as needed for muscle spasms.    [provider]  diclofenac Sodium (VOLTAREN) 1 % GEL Apply 2 g topically 4 (four) times daily as needed (shoulder pain). 04/07/20   [provider]  Docusate Sodium (DSS) 100 MG CAPS Take 100 mg by mouth 2 (two) times daily as needed (hard stools). 11/13/20   [provider]  ferrous sulfate 325 (65 FE) MG tablet Take 325 mg by mouth daily with breakfast.    [provider]  furosemide (LASIX) 40 MG tablet Take 40 mg by mouth daily. 08/06/20   [provider]  gabapentin (NEURONTIN) 300 MG capsule Take 300 mg by mouth 3 (three) times daily as needed (pain). 04/07/20   [provider]  glipiZIDE (GLUCOTROL) 10 MG tablet Take 10 mg by mouth 2 (two) times daily before a meal.    [provider]  levothyroxine (SYNTHROID) 125 MCG tablet Take 125 mcg by mouth daily.    [provider]  pantoprazole (PROTONIX) 40 MG tablet Take 40 mg by mouth daily.    [provider]  pioglitazone (ACTOS) 30 MG tablet Take 15 mg by  mouth daily.    [provider]  potassium chloride SA (KLOR-CON) 20 MEQ tablet Take 20 mEq by mouth daily. 08/06/20   [provider]  sertraline (ZOLOFT) 100 MG tablet Take 100 mg by mouth daily. 05/22/20   [provider]  sildenafil (VIAGRA) 100 MG tablet Take 50 mg by mouth daily as needed for erectile dysfunction. 08/06/20   [provider]  White Petrolatum-Mineral Oil (EYE LUBRICANT) OINT Apply 1 application to eye at bedtime.    [provider]  cetirizine (ZYRTEC ALLERGY) 10 MG tablet Take 1 tablet (10 mg total) by mouth daily. 07/21/14 09/10/19  Palumbo, April, MD       Allergies    Patient has no known allergies.    Review of Systems   Review of Systems  Cardiovascular:  Positive for chest pain.  All other systems reviewed and are negative.   Physical Exam Updated Vital Signs BP 127/82   Pulse 70   Temp 97.8 F (36.6 C) (Oral)   Resp (!) 21   Ht 6\' 1"  (1.854 m)   Wt 93.9 kg   SpO2 98%   BMI 27.32 kg/m  Physical Exam Vitals and nursing note reviewed.  Constitutional:      Appearance: He is well-developed.  HENT:     Head: Normocephalic and atraumatic.  Cardiovascular:     Rate and Rhythm: Normal rate and regular rhythm.     Heart sounds: No murmur heard. Pulmonary:     Effort: Pulmonary effort is normal. No respiratory distress.     Breath sounds: Normal breath sounds.  Abdominal:     Palpations: Abdomen is soft.     Tenderness: There is no abdominal tenderness. There is no guarding or rebound.  Musculoskeletal:        General: No tenderness.     Comments: Trace edema to BLE, right greater than left.  2+ DP pulses bilaterally  Skin:    General: Skin is warm and dry.  Neurological:     Mental Status: He is alert and oriented to person, place, and time.  Psychiatric:        Behavior: Behavior normal.     ED Results / Procedures / Treatments   Labs (all labs ordered are listed, but only abnormal results are displayed) Labs Reviewed - No data to display  EKG EKG Interpretation  Date/Time:  Monday September 13 2021 22:25:30 EDT Ventricular Rate:  70 PR Interval:    QRS Duration: 157 QT Interval:  469 QTC Calculation: 507 R Axis:   -70 Text Interpretation: Afib/flutter and ventricular-paced rhythm No further analysis attempted due to paced rhythm Confirmed by [id number] [phone]) on 09/13/2021 10:30:20 PM  Radiology No results found.  Procedures Procedures  {Document cardiac monitor, telemetry assessment procedure when appropriate:1}  Medications Ordered in ED Medications - No data to display  ED Course/ Medical  Decision Making/ A&P                           Medical Decision Making Amount and/or Complexity of Data Reviewed Labs: ordered. Radiology: ordered.   ***  {Document critical care time when appropriate:1} {Document review of labs and clinical decision tools ie heart score, Chads2Vasc2 etc:1}  {Document your independent review of radiology images, and any outside records:1} {Document your discussion with family members, caretakers, and with consultants:1} {Document social determinants of health affecting pt's care:1} {Document your decision making why or why not admission, treatments were  needed:1} Final Clinical Impression(s) / ED Diagnoses Final diagnoses:  None    Rx / DC Orders ED Discharge Orders     None

## 2021-09-14 LAB — URINALYSIS, ROUTINE W REFLEX MICROSCOPIC
Bilirubin Urine: NEGATIVE
Glucose, UA: NEGATIVE mg/dL
Hgb urine dipstick: NEGATIVE
Ketones, ur: NEGATIVE mg/dL
Leukocytes,Ua: NEGATIVE
Nitrite: NEGATIVE
Protein, ur: NEGATIVE mg/dL
Specific Gravity, Urine: 1.015 (ref 1.005–1.030)
pH: 5 (ref 5.0–8.0)

## 2021-09-14 LAB — COMPREHENSIVE METABOLIC PANEL
ALT: 14 U/L (ref 0–44)
AST: 25 U/L (ref 15–41)
Albumin: 3.4 g/dL — ABNORMAL LOW (ref 3.5–5.0)
Alkaline Phosphatase: 69 U/L (ref 38–126)
Anion gap: 7 (ref 5–15)
BUN: 18 mg/dL (ref 8–23)
CO2: 24 mmol/L (ref 22–32)
Calcium: 8.6 mg/dL — ABNORMAL LOW (ref 8.9–10.3)
Chloride: 106 mmol/L (ref 98–111)
Creatinine, Ser: 1.64 mg/dL — ABNORMAL HIGH (ref 0.61–1.24)
GFR, Estimated: 43 mL/min — ABNORMAL LOW (ref >60)
Glucose, Bld: 132 mg/dL — ABNORMAL HIGH (ref 70–99)
Potassium: 4 mmol/L (ref 3.5–5.1)
Sodium: 137 mmol/L (ref 135–145)
Total Bilirubin: 0.6 mg/dL (ref 0.3–1.2)
Total Protein: 6.7 g/dL (ref 6.5–8.1)

## 2021-09-14 LAB — CBC WITH DIFFERENTIAL/PLATELET
Abs Immature Granulocytes: 0.01 10*3/uL (ref 0.00–0.07)
Basophils Absolute: 0 10*3/uL (ref 0.0–0.1)
Basophils Relative: 0 %
Eosinophils Absolute: 0.1 10*3/uL (ref 0.0–0.5)
Eosinophils Relative: 2 %
HCT: 33 % — ABNORMAL LOW (ref 39.0–52.0)
Hemoglobin: 10.7 g/dL — ABNORMAL LOW (ref 13.0–17.0)
Immature Granulocytes: 0 %
Lymphocytes Relative: 25 %
Lymphs Abs: 1.3 10*3/uL (ref 0.7–4.0)
MCH: 28.9 pg (ref 26.0–34.0)
MCHC: 32.4 g/dL (ref 30.0–36.0)
MCV: 89.2 fL (ref 80.0–100.0)
Monocytes Absolute: 0.6 10*3/uL (ref 0.1–1.0)
Monocytes Relative: 12 %
Neutro Abs: 3.1 10*3/uL (ref 1.7–7.7)
Neutrophils Relative %: 61 %
Platelets: 200 10*3/uL (ref 150–400)
RBC: 3.7 MIL/uL — ABNORMAL LOW (ref 4.22–5.81)
RDW: 15.5 % (ref 11.5–15.5)
WBC: 5.1 10*3/uL (ref 4.0–10.5)
nRBC: 0 % (ref 0.0–0.2)

## 2021-09-14 LAB — TROPONIN I (HIGH SENSITIVITY)
Troponin I (High Sensitivity): 21 ng/L — ABNORMAL HIGH (ref <18)
Troponin I (High Sensitivity): 27 ng/L — ABNORMAL HIGH (ref <18)
Troponin I (High Sensitivity): 27 ng/L — ABNORMAL HIGH (ref <18)

## 2021-09-14 NOTE — Discharge Instructions (Addendum)
It is very important for you to follow-up with your heart doctor to have your ICD looked at more closely.  You can take Senokot or Colace, available over-the-counter according to label instructions with your other medications for constipation.  Your heart enzyme was slightly elevated today.  Please let your heart doctor know about this study when you see them.

## 2021-11-05 ENCOUNTER — Other Ambulatory Visit: Payer: Self-pay

## 2021-11-05 ENCOUNTER — Emergency Department (HOSPITAL_BASED_OUTPATIENT_CLINIC_OR_DEPARTMENT_OTHER): Payer: No Typology Code available for payment source

## 2021-11-05 ENCOUNTER — Emergency Department (HOSPITAL_BASED_OUTPATIENT_CLINIC_OR_DEPARTMENT_OTHER)
Admission: EM | Admit: 2021-11-05 | Discharge: 2021-11-05 | Disposition: A | Discharge status: Home / Self Care | Payer: No Typology Code available for payment source | Attending: Emergency Medicine | Admitting: Emergency Medicine

## 2021-11-05 ENCOUNTER — Encounter (HOSPITAL_BASED_OUTPATIENT_CLINIC_OR_DEPARTMENT_OTHER): Payer: Self-pay

## 2021-11-05 DIAGNOSIS — R0789 Other chest pain: Secondary | ICD-10-CM

## 2021-11-05 DIAGNOSIS — W11XXXA Fall on and from ladder, initial encounter: Secondary | ICD-10-CM | POA: Diagnosis not present

## 2021-11-05 DIAGNOSIS — Z79899 Other long term (current) drug therapy: Secondary | ICD-10-CM | POA: Diagnosis not present

## 2021-11-05 DIAGNOSIS — S2231XA Fracture of one rib, right side, initial encounter for closed fracture: Secondary | ICD-10-CM | POA: Insufficient documentation

## 2021-11-05 DIAGNOSIS — W19XXXA Unspecified fall, initial encounter: Secondary | ICD-10-CM

## 2021-11-05 DIAGNOSIS — S299XXA Unspecified injury of thorax, initial encounter: Secondary | ICD-10-CM | POA: Diagnosis present

## 2021-11-05 MED ORDER — OXYCODONE HCL 5 MG PO TABS: 5.0000 mg | ORAL_TABLET | ORAL | 0 refills | Status: DC | PRN

## 2021-11-05 NOTE — ED Triage Notes (Signed)
Patient states he fell off of a ladder and is now complaining of right rib pain x Tuesday - Patient denies hitting his head - it was off of a 2 step ladder. No LOC - denies taking blood thinners.

## 2021-11-05 NOTE — ED Provider Notes (Signed)
[facility] Provider Note   CSN: [id number] Arrival date & time: 11/05/21  [id number]     History  Chief Complaint  Patient presents with   Rib Injury   Fall    Matthew Browning is a 76 y.o. male.  The patient presents to the hospital complaining of right-sided rib pain secondary to a fall.  Patient states he was on a 2 foot step ladder on Tuesday when he had pain from sciatica and started to fall.  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  He denies hitting his head and denies losing consciousness.  The patient does not use blood thinners.  The patient denies any other injury or complaint.  He does not complain of shortness of breath or headache.  He complains of tenderness to the right side of his chest, pain with movement, and pain with deep inspiration.  Past medical history significant for hypertension, thyroid disease, GI bleed, high cholesterol, DM without complication, and pacemaker  HPI     Home Medications Prior to Admission medications   Medication Sig Start Date End Date Taking? Authorizing Provider  oxyCODONE (ROXICODONE) 5 MG immediate release tablet Take 1 tablet (5 mg total) by mouth every 4 (four) hours as needed for severe pain. 11/05/21  Yes Dorothyann Peng, PA-C  amiodarone (PACERONE) 200 MG tablet Take 200 mg by mouth daily.    [provider]  atorvastatin (LIPITOR) 40 MG tablet Take 20 mg by mouth at bedtime.    [provider]  carboxymethylcellulose (REFRESH PLUS) 0.5 % SOLN Place 1 drop into both eyes every 2 (two) hours. 11/20/20   [provider]  carvedilol (COREG) 25 MG tablet Take 12.5 mg by mouth 2 (two) times daily with a meal.    [provider]  Cholecalciferol 50 MCG (2000 UT) TABS Take 2,000 Units by mouth daily. 08/06/20   [provider]   colchicine 0.6 MG tablet Take 0.6-1.2 mg by mouth See admin instructions. Take 1.2mg  once for 1 day, then take 0.6mg  once daily as needed for gout flare 11/13/20   [provider]  cyclobenzaprine (FLEXERIL) 10 MG tablet Take 5-10 mg by mouth 3 (three) times daily as needed for muscle spasms.    [provider]  diclofenac Sodium (VOLTAREN) 1 % GEL Apply 2 g topically 4 (four) times daily as needed (shoulder pain). 04/07/20   [provider]  Docusate Sodium (DSS) 100 MG CAPS Take 100 mg by mouth 2 (two) times daily as needed (hard stools). 11/13/20   [provider]  ferrous sulfate 325 (65 FE) MG tablet Take 325 mg by mouth daily with breakfast.    [provider]  furosemide (LASIX) 40 MG tablet Take 40 mg by mouth daily. 08/06/20   [provider]  gabapentin (NEURONTIN) 300 MG capsule Take 300 mg by mouth 3 (three) times daily as needed (pain). 04/07/20   [provider]  glipiZIDE (GLUCOTROL) 10 MG tablet Take 10 mg by mouth 2 (two) times daily before a meal.    [provider]  levothyroxine (SYNTHROID) 125 MCG tablet Take 125 mcg by mouth daily.    [provider]  pantoprazole (PROTONIX) 40 MG tablet Take 40 mg by mouth daily.    [provider]  pioglitazone (ACTOS) 30  MG tablet Take 15 mg by mouth daily.    [provider]  potassium chloride SA (KLOR-CON) 20 MEQ tablet Take 20 mEq by mouth daily. 08/06/20   [provider]  sertraline (ZOLOFT) 100 MG tablet Take 100 mg by mouth daily. 05/22/20   [provider]  sildenafil (VIAGRA) 100 MG tablet Take 50 mg by mouth daily as needed for erectile dysfunction. 08/06/20   [provider]  White Petrolatum-Mineral Oil (EYE LUBRICANT) OINT Apply 1 application to eye at bedtime.    [provider]  cetirizine (ZYRTEC ALLERGY) 10 MG tablet Take 1 tablet (10 mg total) by mouth daily. 07/21/14 09/10/19  Palumbo, April, MD       Allergies    Patient has no known allergies.    Review of Systems   Review of Systems  Respiratory:  Negative for shortness of breath.   Cardiovascular:  Positive for chest pain.  Gastrointestinal:  Negative for abdominal pain.    Physical Exam Updated Vital Signs BP 118/75   Pulse 69   Temp 98.2 F (36.8 C) (Oral)   Resp 16   Ht 6\' 1"  (1.854 m)   Wt 90.7 kg   SpO2 98%   BMI 26.39 kg/m  Physical Exam Vitals and nursing note reviewed.  Constitutional:      General: He is not in acute distress.    Appearance: He is normal weight.  HENT:     Head: Normocephalic and atraumatic.     Mouth/Throat:     Pharynx: Oropharynx is clear.  Eyes:     Conjunctiva/sclera: Conjunctivae normal.  Cardiovascular:     Rate and Rhythm: Normal rate and regular rhythm.     Pulses: Normal pulses.     Heart sounds: Normal heart sounds.  Pulmonary:     Effort: Pulmonary effort is normal.     Breath sounds: Normal breath sounds.  Musculoskeletal:        General: Normal range of motion.     Cervical back: Normal range of motion and neck supple.     Comments: Tenderness to palpation of the anterior lower chest on the right side, no bruising noted, no crepitus  Skin:    General: Skin is warm and dry.     Capillary Refill: Capillary refill takes less than 2 seconds.     Findings: No bruising.  Neurological:     Mental Status: He is alert and oriented to person, place, and time.     ED Results / Procedures / Treatments   Labs (all labs ordered are listed, but only abnormal results are displayed) Labs Reviewed - No data to display  EKG None  Radiology DG Ribs Unilateral Right  Result Date: 11/05/2021 CLINICAL DATA:  Right anterior rib pain. EXAM: RIGHT RIBS - 2 VIEW COMPARISON:  None Available. FINDINGS: Acute versus subacute nondisplaced fracture of the right anterolateral eighth rib. No other fracture. No pleural reaction. IMPRESSION: 1. Acute versus subacute nondisplaced fracture  of the right anterolateral eighth rib. Electronically Signed   By: 01/05/2022 M.D.   On: 11/05/2021 10:19   DG Chest 2 View  Result Date: 11/05/2021 CLINICAL DATA:  Pain. EXAM: CHEST - 2 VIEW COMPARISON:  Chest x-ray September 13, 2021. FINDINGS: Stable elevation right hemidiaphragm. No consolidation. No visible pleural effusions or pneumothorax. Cardiomediastinal silhouette is unchanged. Left subclavian approach cardiac rhythm maintenance device. Polyarticular degenerative change. IMPRESSION: No evidence of acute cardiopulmonary disease. Electronically Signed   By: September 15, 2021.D.  On: 11/05/2021 09:46    Procedures Procedures    Medications Ordered in ED Medications - No data to display  ED Course/ Medical Decision Making/ A&P                           Medical Decision Making Amount and/or Complexity of Data Reviewed Radiology: ordered.   The patient presents with a chief complaint of right-sided chest pain after a fall.  Differential includes but is not limited to musculoskeletal injury, ACS, PE, and others  I reviewed the patient's past medical history including discharge summary from October 2022 for hematemesis  I ordered and personally interpreted imaging including chest x-ray and x-ray of the right ribs. . Acute versus subacute nondisplaced fracture of the right  anterolateral eighth rib  I agree with the radiologist findings  EKG shows an accelerated junctional rhythm.  The patient does have a pacemaker  I ordered incentive spirometry for the patient.  The patient's chief symptom is pain on the right side at the spot of tenderness.  This definitely seems to be musculoskeletal in nature.  He has significant tenderness when the area is palpated.  EKG shows no signs of ACS.  He is not tachycardic and not short of breath to suggest PE.  Very low clinical suspicion of ACS or pulmonary embolism.  I see no indication at this time for admission to the hospital.  The patient  has O2 saturations and is not in respiratory distress.  Plan to discharge patient home at this time.  Plan to prescribe short course of oxycodone.  Patient may also take 1000 mg Tylenol for pain.  Oxy will be for breakthrough pain.  Patient will be instructed to use incentive spirometer at home.  Follow-up with primary care       Final Clinical Impression(s) / ED Diagnoses Final diagnoses:  Fall, initial encounter  Chest wall tenderness  Closed fracture of one rib of right side, initial encounter    Rx / DC Orders ED Discharge Orders          Ordered    oxyCODONE (ROXICODONE) 5 MG immediate release tablet  Every 4 hours PRN        11/05/21 1033              Pamala Duffel 11/05/21 1035    Lonell Grandchild, MD 11/05/21 1324

## 2021-11-05 NOTE — Discharge Instructions (Addendum)
You were seen today for chest pain after a fall.  You have a nondisplaced fracture of the eighth rib on the right side.  This will take some time to heal.  Please take 1000 mg Tylenol 3-4 times daily.  I have prescribed a short course of oxycodone which may be taken for severe breakthrough pain.  Please use the incentive spirometer as provided.  Follow-up as needed with your primary care provider

## 2021-11-06 ENCOUNTER — Encounter (HOSPITAL_BASED_OUTPATIENT_CLINIC_OR_DEPARTMENT_OTHER): Payer: Self-pay | Admitting: Emergency Medicine

## 2021-11-06 ENCOUNTER — Emergency Department (HOSPITAL_BASED_OUTPATIENT_CLINIC_OR_DEPARTMENT_OTHER)
Admission: EM | Admit: 2021-11-06 | Discharge: 2021-11-07 | Disposition: A | Discharge status: Home / Self Care | Payer: No Typology Code available for payment source | Attending: Emergency Medicine | Admitting: Emergency Medicine

## 2021-11-06 ENCOUNTER — Emergency Department (HOSPITAL_BASED_OUTPATIENT_CLINIC_OR_DEPARTMENT_OTHER): Payer: No Typology Code available for payment source

## 2021-11-06 DIAGNOSIS — W11XXXA Fall on and from ladder, initial encounter: Secondary | ICD-10-CM | POA: Insufficient documentation

## 2021-11-06 DIAGNOSIS — R55 Syncope and collapse: Secondary | ICD-10-CM | POA: Insufficient documentation

## 2021-11-06 DIAGNOSIS — R0789 Other chest pain: Secondary | ICD-10-CM | POA: Diagnosis not present

## 2021-11-06 DIAGNOSIS — R0602 Shortness of breath: Secondary | ICD-10-CM | POA: Diagnosis not present

## 2021-11-06 DIAGNOSIS — R42 Dizziness and giddiness: Secondary | ICD-10-CM | POA: Diagnosis not present

## 2021-11-06 DIAGNOSIS — K429 Umbilical hernia without obstruction or gangrene: Secondary | ICD-10-CM | POA: Insufficient documentation

## 2021-11-06 LAB — BASIC METABOLIC PANEL
Anion gap: 8 (ref 5–15)
BUN: 21 mg/dL (ref 8–23)
CO2: 24 mmol/L (ref 22–32)
Calcium: 8.6 mg/dL — ABNORMAL LOW (ref 8.9–10.3)
Chloride: 103 mmol/L (ref 98–111)
Creatinine, Ser: 1.3 mg/dL — ABNORMAL HIGH (ref 0.61–1.24)
GFR, Estimated: 57 mL/min — ABNORMAL LOW (ref >60)
Glucose, Bld: 155 mg/dL — ABNORMAL HIGH (ref 70–99)
Potassium: 4 mmol/L (ref 3.5–5.1)
Sodium: 135 mmol/L (ref 135–145)

## 2021-11-06 LAB — CBC
HCT: 37.4 % — ABNORMAL LOW (ref 39.0–52.0)
Hemoglobin: 11.8 g/dL — ABNORMAL LOW (ref 13.0–17.0)
MCH: 28.8 pg (ref 26.0–34.0)
MCHC: 31.6 g/dL (ref 30.0–36.0)
MCV: 91.2 fL (ref 80.0–100.0)
Platelets: 219 10*3/uL (ref 150–400)
RBC: 4.1 MIL/uL — ABNORMAL LOW (ref 4.22–5.81)
RDW: 15.8 % — ABNORMAL HIGH (ref 11.5–15.5)
WBC: 5.6 10*3/uL (ref 4.0–10.5)
nRBC: 0 % (ref 0.0–0.2)

## 2021-11-06 LAB — TROPONIN I (HIGH SENSITIVITY)
Troponin I (High Sensitivity): 30 ng/L — ABNORMAL HIGH (ref <18)
Troponin I (High Sensitivity): 8 ng/L (ref <18)

## 2021-11-06 LAB — CBG MONITORING, ED: Glucose-Capillary: 118 mg/dL — ABNORMAL HIGH (ref 70–99)

## 2021-11-06 MED ORDER — TRAMADOL HCL 50 MG PO TABS: 50.0000 mg | ORAL_TABLET | Freq: Four times a day (QID) | ORAL | 0 refills | Status: AC | PRN

## 2021-11-06 MED ORDER — IOHEXOL 300 MG/ML  SOLN
100.0000 mL | Freq: Once | INTRAMUSCULAR | Status: AC | PRN
  Administered 2021-11-06: 100 mL via INTRAVENOUS

## 2021-11-06 MED ORDER — SODIUM CHLORIDE 0.9 % IV BOLUS: 500.0000 mL | Freq: Once | INTRAVENOUS | Status: DC

## 2021-11-06 MED ORDER — SODIUM CHLORIDE 0.9 % IV BOLUS
500.0000 mL | Freq: Once | INTRAVENOUS | Status: AC
  Administered 2021-11-06: 500 mL via INTRAVENOUS

## 2021-11-06 NOTE — ED Notes (Signed)
EDP aware of troponin 30.

## 2021-11-06 NOTE — Discharge Instructions (Signed)
Follow-up with your primary care doctor within the next week for recheck.  Return to emergency room if you have any worsening symptoms.

## 2021-11-06 NOTE — ED Provider Notes (Addendum)
[facility] EMERGENCY DEPARTMENT Provider Note   CSN: [id number] Arrival date & time: 11/06/21  2002     History  Chief Complaint  Patient presents with   Dizziness   Shortness of Breath    Zacariah Belue is a 76 y.o. male.  Patient is a 76 year old male who presents with dizziness and shortness of breath.  He says that 5 days ago he had a fall.  He was standing on a stepladder and his sciatica acted up, causing him to fall, he landed on his right side.  He was seen in the ED yesterday for some rib pain.  He has some pain to his lower chest wall.  He had an x-ray done which showed a rib fracture.  He was started on oxycodone.  He said he took 2 doses of the oxycodone earlier today.  He did not have any bad effects.  He was eating dinner this evening and had a sudden onset of dizziness.  He describes everything was spinning.  He also felt short of breath during this time.  His wife says he got diaphoretic.  He denied any chest pain other than the rib pain.  No nausea or vomiting.  He said it eased off and then while he was in the room talking to him, he said he was feeling bad again.  He started to moan and said he was feeling short of breath.       Home Medications Prior to Admission medications   Medication Sig Start Date End Date Taking? Authorizing Provider  traMADol (ULTRAM) 50 MG tablet Take 1 tablet (50 mg total) by mouth every 6 (six) hours as needed. 11/06/21  Yes Rolan Bucco, MD  amiodarone (PACERONE) 200 MG tablet Take 200 mg by mouth daily.    [provider]  atorvastatin (LIPITOR) 40 MG tablet Take 20 mg by mouth at bedtime.    [provider]  carboxymethylcellulose (REFRESH PLUS) 0.5 % SOLN Place 1 drop into both eyes every 2 (two) hours. 11/20/20   [provider]  carvedilol (COREG) 25 MG tablet Take 12.5 mg by mouth 2 (two) times daily with a meal.    [provider]  Cholecalciferol 50 MCG (2000 UT) TABS Take 2,000 Units  by mouth daily. 08/06/20   [provider]  colchicine 0.6 MG tablet Take 0.6-1.2 mg by mouth See admin instructions. Take 1.2mg  once for 1 day, then take 0.6mg  once daily as needed for gout flare 11/13/20   [provider]  cyclobenzaprine (FLEXERIL) 10 MG tablet Take 5-10 mg by mouth 3 (three) times daily as needed for muscle spasms.    [provider]  diclofenac Sodium (VOLTAREN) 1 % GEL Apply 2 g topically 4 (four) times daily as needed (shoulder pain). 04/07/20   [provider]  Docusate Sodium (DSS) 100 MG CAPS Take 100 mg by mouth 2 (two) times daily as needed (hard stools). 11/13/20   [provider]  ferrous sulfate 325 (65 FE) MG tablet Take 325 mg by mouth daily with breakfast.    [provider]  furosemide (LASIX) 40 MG tablet Take 40 mg by mouth daily. 08/06/20   [provider]  gabapentin (NEURONTIN) 300 MG capsule Take 300 mg by mouth 3 (three) times daily as needed (pain). 04/07/20   [provider]  glipiZIDE (GLUCOTROL) 10 MG tablet Take 10 mg by mouth 2 (two) times daily before a meal.    [provider]  levothyroxine (SYNTHROID)  125 MCG tablet Take 125 mcg by mouth daily.    [provider]  pantoprazole (PROTONIX) 40 MG tablet Take 40 mg by mouth daily.    [provider]  pioglitazone (ACTOS) 30 MG tablet Take 15 mg by mouth daily.    [provider]  potassium chloride SA (KLOR-CON) 20 MEQ tablet Take 20 mEq by mouth daily. 08/06/20   [provider]  sertraline (ZOLOFT) 100 MG tablet Take 100 mg by mouth daily. 05/22/20   [provider]  sildenafil (VIAGRA) 100 MG tablet Take 50 mg by mouth daily as needed for erectile dysfunction. 08/06/20   [provider]  White Petrolatum-Mineral Oil (EYE LUBRICANT) OINT Apply 1 application to eye at bedtime.    [provider]  cetirizine (ZYRTEC ALLERGY) 10 MG tablet Take 1 tablet (10 mg total) by  mouth daily. 07/21/14 09/10/19  Palumbo, April, MD      Allergies    Patient has no known allergies.    Review of Systems   Review of Systems  Constitutional:  Positive for diaphoresis and fatigue. Negative for chills and fever.  HENT:  Negative for congestion, rhinorrhea and sneezing.   Eyes: Negative.   Respiratory:  Positive for shortness of breath. Negative for cough and chest tightness.   Cardiovascular:  Positive for chest pain (Rib pain). Negative for leg swelling.  Gastrointestinal:  Positive for abdominal pain. Negative for blood in stool, diarrhea, nausea and vomiting.  Genitourinary:  Negative for difficulty urinating, flank pain, frequency and hematuria.  Musculoskeletal:  Negative for arthralgias and back pain.  Skin:  Negative for rash.  Neurological:  Positive for dizziness. Negative for speech difficulty, weakness, numbness and headaches.    Physical Exam Updated Vital Signs BP 112/74   Pulse 69   Temp 97.8 F (36.6 C) (Oral)   Resp 14   Ht 6\' 1"  (1.854 m)   Wt 90 kg   SpO2 95%   BMI 26.18 kg/m  Physical Exam Constitutional:      Appearance: He is well-developed. He is ill-appearing.  HENT:     Head: Normocephalic and atraumatic.  Eyes:     Pupils: Pupils are equal, round, and reactive to light.     Comments: No nystagmus  Cardiovascular:     Rate and Rhythm: Normal rate and regular rhythm.     Heart sounds: Normal heart sounds.  Pulmonary:     Effort: Pulmonary effort is normal. No respiratory distress.     Breath sounds: Normal breath sounds. No wheezing or rales.  Chest:     Chest wall: Tenderness (Tenderness to the right chest wall) present.  Abdominal:     General: Bowel sounds are normal.     Palpations: Abdomen is soft.     Tenderness: There is no abdominal tenderness (Tenderness to the right upper abdomen). There is no guarding or rebound.  Musculoskeletal:        General: Normal range of motion.     Cervical back: Normal range of motion and  neck supple.  Lymphadenopathy:     Cervical: No cervical adenopathy.  Skin:    General: Skin is warm and dry.     Findings: No rash.  Neurological:     General: No focal deficit present.     Mental Status: He is alert and oriented to person, place, and time.     ED Results / Procedures / Treatments   Labs (all labs ordered are listed, but only abnormal results are  displayed) Labs Reviewed  BASIC METABOLIC PANEL - Abnormal; Notable for the following components:      Result Value   Glucose, Bld 155 (*)    Creatinine, Ser 1.30 (*)    Calcium 8.6 (*)    GFR, Estimated 57 (*)    All other components within normal limits  CBC - Abnormal; Notable for the following components:   RBC 4.10 (*)    Hemoglobin 11.8 (*)    HCT 37.4 (*)    RDW 15.8 (*)    All other components within normal limits  CBG MONITORING, ED - Abnormal; Notable for the following components:   Glucose-Capillary 118 (*)    All other components within normal limits  TROPONIN I (HIGH SENSITIVITY)  TROPONIN I (HIGH SENSITIVITY)    EKG EKG Interpretation  Date/Time:  Saturday November 06 2021 20:07:04 EDT Ventricular Rate:  78 PR Interval:  200 QRS Duration: 88 QT Interval:  368 QTC Calculation: 419 R Axis:   -5 Text Interpretation: Sinus rhythm with frequent ventricular-paced complexes and with frequent Premature ventricular complexes ST & T wave abnormality, consider lateral ischemia Abnormal ECG When compared with ECG of 05-Nov-2021 10:19, PREVIOUS ECG IS PRESENT Confirmed by Rolan Bucco [phone]) on 11/06/2021 8:26:07 PM  Radiology CT CHEST ABDOMEN PELVIS W CONTRAST  Result Date: 11/06/2021 CLINICAL DATA:  Trauma.  Fall. EXAM: CT CHEST, ABDOMEN, AND PELVIS WITH CONTRAST TECHNIQUE: Multidetector CT imaging of the chest, abdomen and pelvis was performed following the standard protocol during bolus administration of intravenous contrast. RADIATION DOSE REDUCTION: This exam was performed according to the  departmental dose-optimization program which includes automated exposure control, adjustment of the mA and/or kV according to patient size and/or use of iterative reconstruction technique. CONTRAST:  OMNIPAQUE IOHEXOL 300 MG/ML  SOLN COMPARISON:  Rib x-ray 11/05/2021. CT abdomen and pelvis 03/25/2014. FINDINGS: CT CHEST FINDINGS Cardiovascular: The heart is mildly enlarged. The aorta is normal in size. There is no pericardial effusion. Left-sided pacemaker is present. Mediastinum/Nodes: No enlarged mediastinal, hilar, or axillary lymph nodes. Thyroid gland, trachea, and esophagus demonstrate no significant findings. Lungs/Pleura: There are minimal patchy ground-glass opacities in both lung bases/lower lobes. There is some additional atelectasis in the right lower lobe. There is no pleural effusion or pneumothorax. Musculoskeletal: There is an acute nondisplaced anterolateral right eighth rib fracture. There are additional multiple healed bilateral anterior rib fractures. No other acute fractures are seen. CT ABDOMEN PELVIS FINDINGS Hepatobiliary: No focal liver abnormality is seen. No gallstones, gallbladder wall thickening, or biliary dilatation. Pancreas: Unremarkable. No pancreatic ductal dilatation or surrounding inflammatory changes. Spleen: Normal in size without focal abnormality. Adrenals/Urinary Tract: There is a 15 mm left renal cyst which has minimally increased in size compared to 2015. Otherwise, the adrenal glands, kidneys and bladder are within normal limits. Stomach/Bowel: Stomach is within normal limits. Appendix appears normal. No evidence of bowel wall thickening, distention, or inflammatory changes. Vascular/Lymphatic: No significant vascular findings are present. No enlarged abdominal or pelvic lymph nodes. Reproductive: Prostate gland is enlarged. Other: There is a small fat containing umbilical hernia. There is no free fluid. Musculoskeletal: No acute fractures. Multilevel degenerative  changes affect the spine. IMPRESSION: 1. Acute nondisplaced right eighth rib fracture. No evidence for pneumothorax. 2. Minimal atelectasis in the right lower lobe. 3. Mild cardiomegaly. 4. No acute localizing process in the abdomen or pelvis. 5. 1.5 cm left Bosniak 1 benign simple cyst. No follow-up imaging is recommended. JACR 2018 Feb; 264-273, Management of the Incidental RenalMass on  CT, [employer] 2021; 814-848, Bosniak Classification of Cystic Renal Masses, Version 2019. 6. Prostatomegaly. Electronically Signed   By: Darliss Cheney M.D.   On: 11/06/2021 21:19   CT Head Wo Contrast  Result Date: 11/06/2021 CLINICAL DATA:  Recent fall EXAM: CT HEAD WITHOUT CONTRAST TECHNIQUE: Contiguous axial images were obtained from the base of the skull through the vertex without intravenous contrast. RADIATION DOSE REDUCTION: This exam was performed according to the departmental dose-optimization program which includes automated exposure control, adjustment of the mA and/or kV according to patient size and/or use of iterative reconstruction technique. COMPARISON:  11/18/2017 FINDINGS: Brain: No evidence of acute infarction, hemorrhage, hydrocephalus, extra-axial collection or mass lesion/mass effect. Mild atrophic and chronic white matter ischemic changes are noted. 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 Vascular: No hyperdense vessel or unexpected calcification. Skull: Normal. Negative for fracture or focal lesion. Sinuses/Orbits: No acute finding. Other: None. IMPRESSION: Chronic atrophic and ischemic changes without acute abnormality. Electronically Signed   By: Alcide Clever M.D.   On: 11/06/2021 21:18   DG Ribs Unilateral Right  Result Date: 11/05/2021 CLINICAL DATA:  Right anterior rib pain. EXAM: RIGHT RIBS - 2 VIEW COMPARISON:  None Available. FINDINGS: Acute versus subacute nondisplaced fracture of the right anterolateral eighth rib. No other fracture. No pleural reaction. IMPRESSION: 1.  Acute versus subacute nondisplaced fracture of the right anterolateral eighth rib. Electronically Signed   By: Elige Ko M.D.   On: 11/05/2021 10:19   DG Chest 2 View  Result Date: 11/05/2021 CLINICAL DATA:  Pain. EXAM: CHEST - 2 VIEW COMPARISON:  Chest x-ray September 13, 2021. FINDINGS: Stable elevation right hemidiaphragm. No consolidation. No visible pleural effusions or pneumothorax. Cardiomediastinal silhouette is unchanged. Left subclavian approach cardiac rhythm maintenance device. Polyarticular degenerative change. IMPRESSION: No evidence of acute cardiopulmonary disease. Electronically Signed   By: Feliberto Harts M.D.   On: 11/05/2021 09:46    Procedures Procedures    Medications Ordered in ED Medications  sodium chloride 0.9 % bolus 500 mL (has no administration in time range)  sodium chloride 0.9 % bolus 500 mL (0 mLs Intravenous Stopped 11/06/21 2300)  iohexol (OMNIPAQUE) 300 MG/ML solution 100 mL (100 mLs Intravenous Contrast Given 11/06/21 2050)    ED Course/ Medical Decision Making/ A&P                           Medical Decision Making Amount and/or Complexity of Data Reviewed Independent Historian: spouse External Data Reviewed: notes.    Details: hospital notes Labs: ordered. Decision-making details documented in ED Course. Radiology: ordered. Decision-making details documented in ED Course. ECG/medicine tests: ordered and independent interpretation performed. Decision-making details documented in ED Course.  Risk Prescription drug management. Decision regarding hospitalization.   Patient is a 76 year old male who presents with dizziness.  He had a recent fall off of a stepladder pattern he was diagnosed with a rib fracture yesterday.  He has been taking oxycodone every 4 hours for this.  On arrival, he is a bit diaphoretic and complaining of dizziness and shortness of breath.  He was slow to respond to questions but did not have any focal neurologic deficits.  He  is moving all extremities symmetrically.  Given his recent trauma, scans were performed of his head, chest and abdomen pelvis.  Other than the single rib fracture, no other traumatic injuries are identified.  No other acute abnormalities.  His EKG does not show any ischemic  changes.  No arrhythmias noted.  His labs are nonconcerning.  He was given some IV fluids.  He is feeling much better after period of monitoring.  His symptoms resolved.  He is fully alert and oriented.  He is a little bit sleepy but otherwise appears intact.  I suspect his symptoms are potentially related from a near syncopal episode, possibly related to his pain medication.  Discussed with the patient and his wife about changing it to something a little bit more mild.  We will switch to tramadol.  He also had some episodes where his oxygen saturations dipped down into the 80s.  He was given incentive spirometer and on using this, they went back up into the high 90s.  I suspect he has some atelectasis.  He was encouraged to do this regularly at home.  Was encouraged to follow-up with his primary care doctor.  Was encouraged to return if he has any worsening symptoms.  Patient be discharged following second troponin, assuming no indication for change of plans at that time.   00:09 patient's repeat troponin was mildly elevated.  Plan to check a third troponin.  If it stable, patient can likely be discharged.  If it is rising, patient will likely need admission.  Dr. Bernette MayersSheldon to take over care pending this result.  Final Clinical Impression(s) / ED Diagnoses Final diagnoses:  Near syncope    Rx / DC Orders ED Discharge Orders          Ordered    traMADol (ULTRAM) 50 MG tablet  Every 6 hours PRN        11/06/21 2337              Rolan BuccoBelfi, Alba Kriesel, MD 11/06/21 [id number]    Rolan BuccoBelfi, Jermya Dowding, MD 11/06/21 [id number]    Rolan BuccoBelfi, Lashayla Armes, MD 11/07/21 0010

## 2021-11-06 NOTE — ED Triage Notes (Signed)
Seen yeserday for a fall, has been taking Rx every 4 hours for broken rib. Today dizziness/near syncope and SOB.

## 2021-11-07 LAB — TROPONIN I (HIGH SENSITIVITY): Troponin I (High Sensitivity): 27 ng/L — ABNORMAL HIGH (ref <18)

## 2021-11-07 NOTE — ED Provider Notes (Signed)
Care of the patient assumed at the change of shift. Here for dizziness, likely from percocet given for rib fracture after recent fall. Initial trop was normal, repeat was mildly elevated (but more consistent with previous), dispo per third trop.   2:23 AM Third trop remains flat compared to second. Doubt acute ACS as a cause. Plan discharge per Dr. Christoper Fabian plan.    Pollyann Savoy, MD 11/07/21 [phone]

## 2022-04-19 IMAGING — DX DG CHEST 2V
2 series · 2 of 2 positions shown · non-contrast
Comparison: Radiograph 05/29/2016

CLINICAL DATA: Near syncope.

EXAM:
CHEST - 2 VIEW

[chest pa]
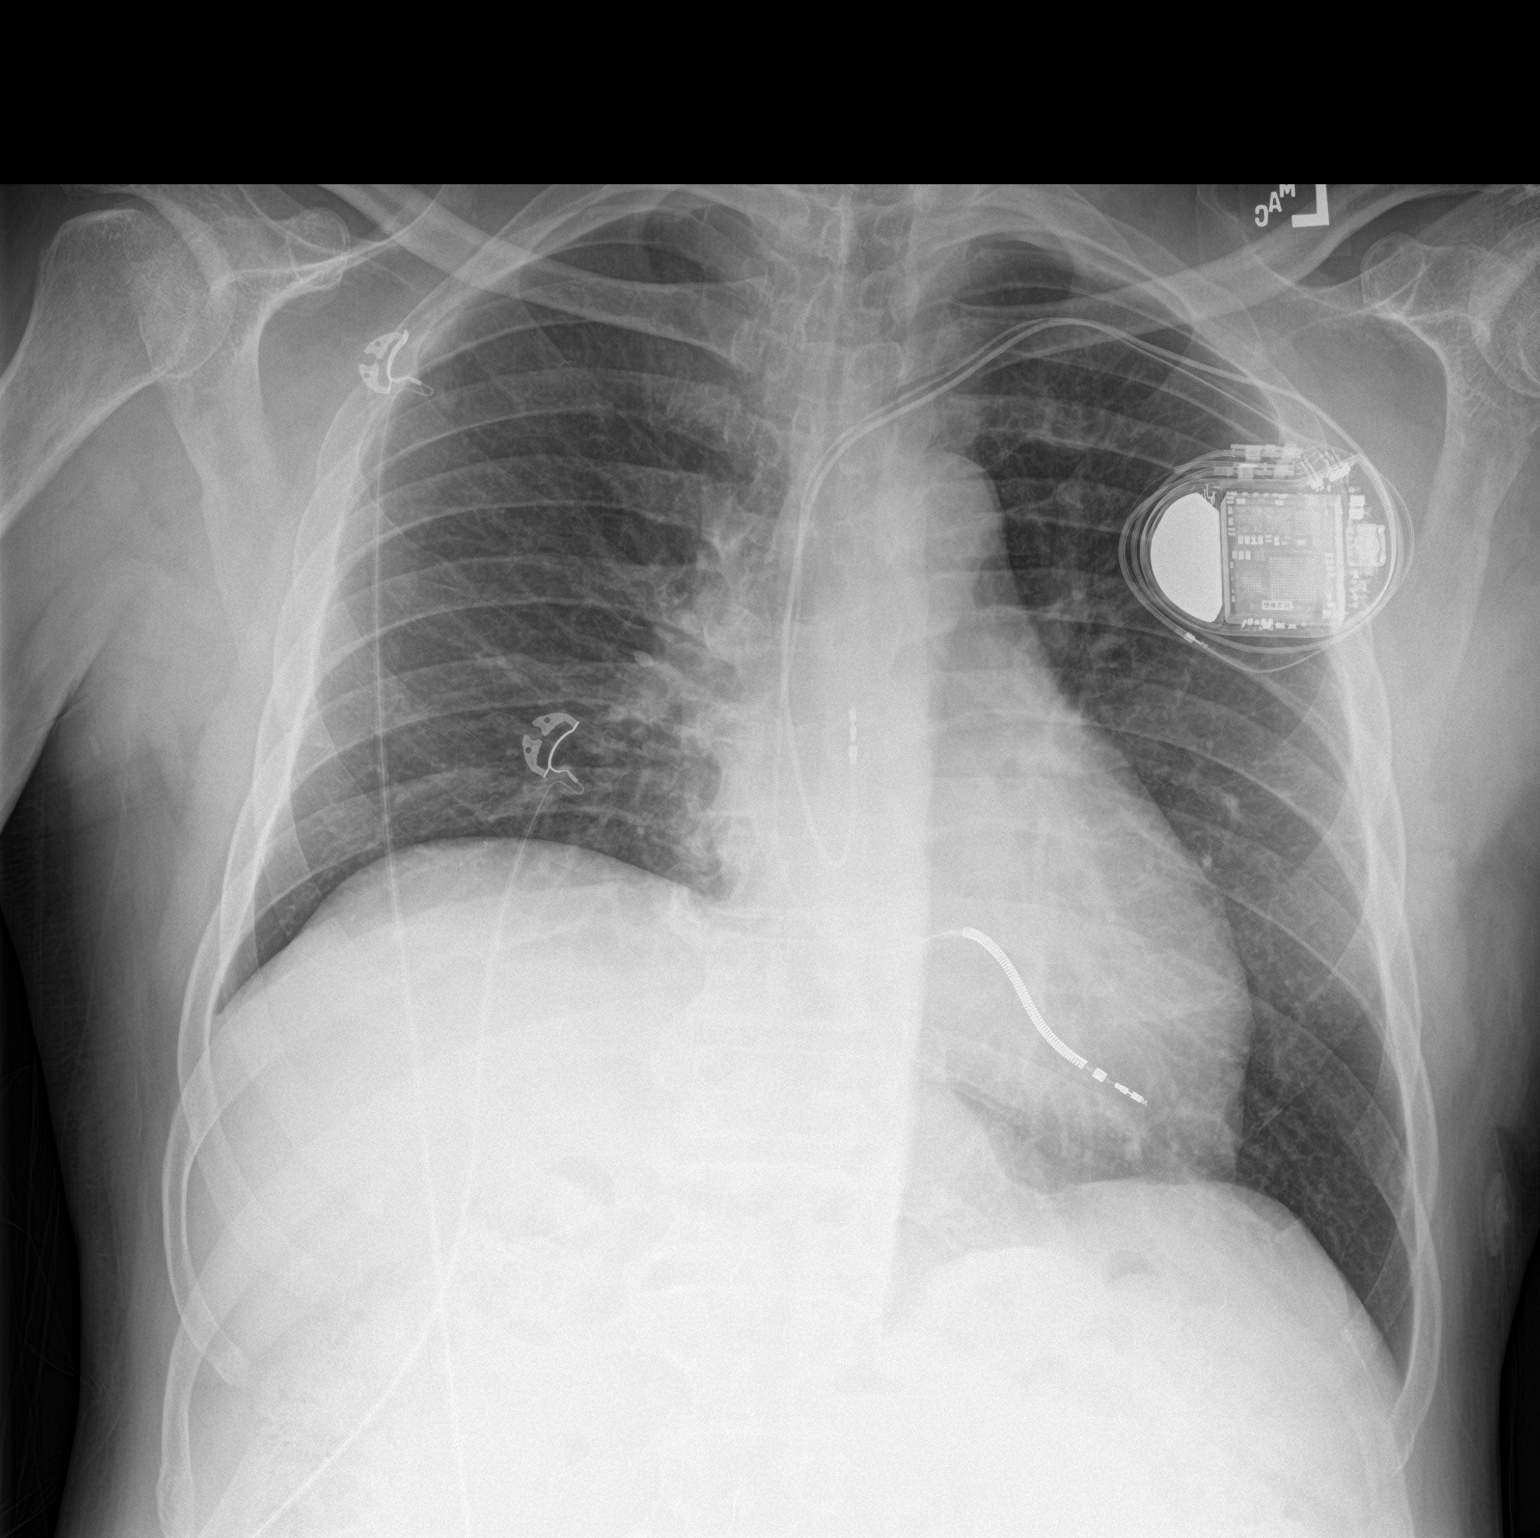

[chest lat]
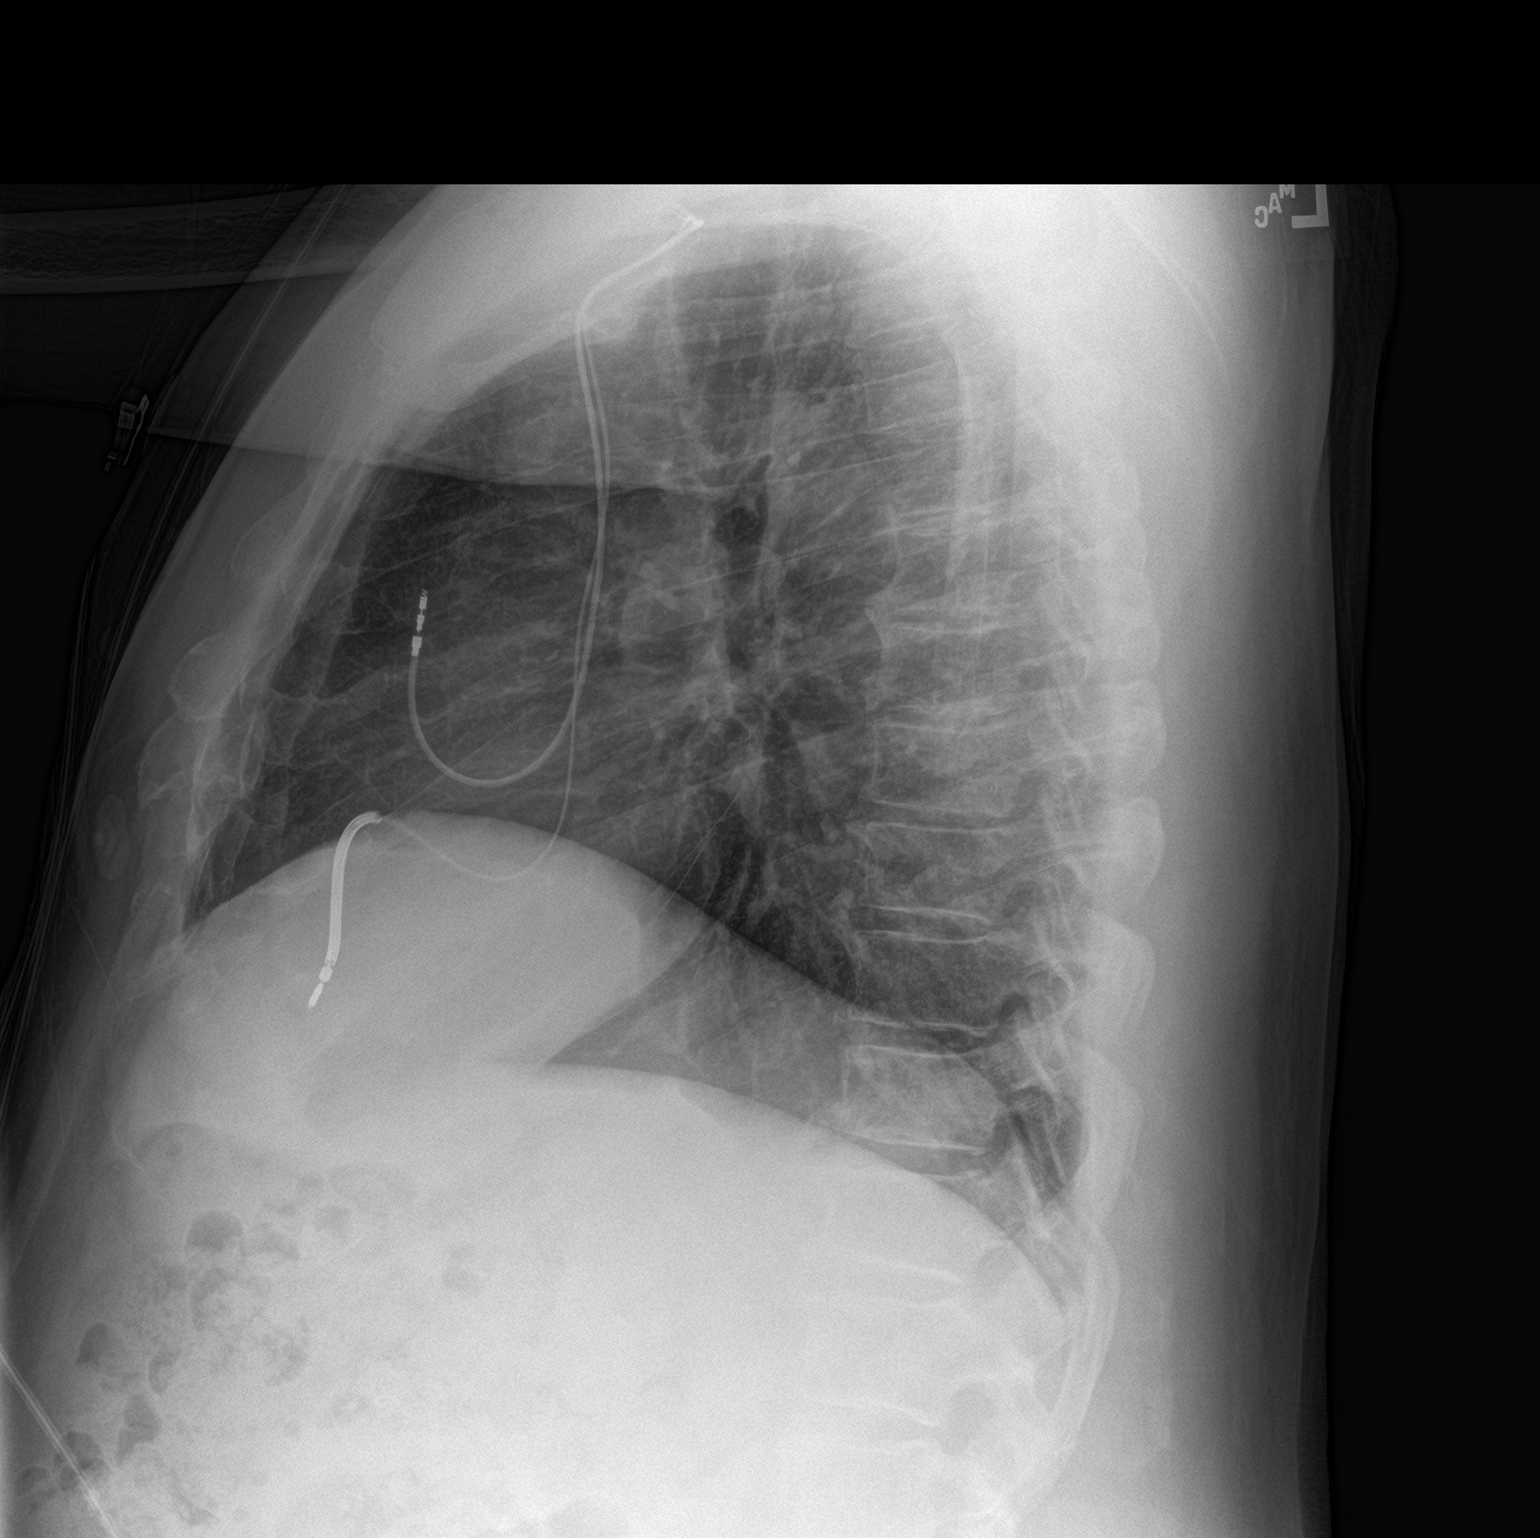

[2 of 2 positions shown; findings below may reference images not displayed]

FINDINGS: Dual lead left-sided pacemaker remains in place. Unchanged heart
size and mediastinal contours. Mild chronic elevation of right
hemidiaphragm. No focal airspace disease, pulmonary edema, pleural
effusion or pneumothorax. No acute osseous abnormalities are seen.
IMPRESSION: No acute chest findings.

## 2023-02-13 ENCOUNTER — Emergency Department (HOSPITAL_BASED_OUTPATIENT_CLINIC_OR_DEPARTMENT_OTHER)
Admission: EM | Admit: 2023-02-13 | Discharge: 2023-02-13 | Disposition: A | Discharge status: Home / Self Care | Payer: Medicare PPO | Attending: Emergency Medicine | Admitting: Emergency Medicine

## 2023-02-13 ENCOUNTER — Encounter (HOSPITAL_BASED_OUTPATIENT_CLINIC_OR_DEPARTMENT_OTHER): Payer: Self-pay | Admitting: Urology

## 2023-02-13 ENCOUNTER — Emergency Department (HOSPITAL_BASED_OUTPATIENT_CLINIC_OR_DEPARTMENT_OTHER): Payer: Medicare PPO

## 2023-02-13 ENCOUNTER — Other Ambulatory Visit: Payer: Self-pay

## 2023-02-13 DIAGNOSIS — K5900 Constipation, unspecified: Secondary | ICD-10-CM | POA: Diagnosis present

## 2023-02-13 DIAGNOSIS — I10 Essential (primary) hypertension: Secondary | ICD-10-CM | POA: Diagnosis not present

## 2023-02-13 DIAGNOSIS — Z79899 Other long term (current) drug therapy: Secondary | ICD-10-CM | POA: Diagnosis not present

## 2023-02-13 DIAGNOSIS — Z7984 Long term (current) use of oral hypoglycemic drugs: Secondary | ICD-10-CM | POA: Diagnosis not present

## 2023-02-13 LAB — COMPREHENSIVE METABOLIC PANEL
ALT: 16 U/L (ref 0–44)
AST: 21 U/L (ref 15–41)
Albumin: 3.6 g/dL (ref 3.5–5.0)
Alkaline Phosphatase: 76 U/L (ref 38–126)
Anion gap: 6 (ref 5–15)
BUN: 20 mg/dL (ref 8–23)
CO2: 28 mmol/L (ref 22–32)
Calcium: 8.9 mg/dL (ref 8.9–10.3)
Chloride: 102 mmol/L (ref 98–111)
Creatinine, Ser: 1.32 mg/dL — ABNORMAL HIGH (ref 0.61–1.24)
GFR, Estimated: 56 mL/min — ABNORMAL LOW (ref >60)
Glucose, Bld: 112 mg/dL — ABNORMAL HIGH (ref 70–99)
Potassium: 4.6 mmol/L (ref 3.5–5.1)
Sodium: 136 mmol/L (ref 135–145)
Total Bilirubin: 0.7 mg/dL (ref <1.2)
Total Protein: 7 g/dL (ref 6.5–8.1)

## 2023-02-13 LAB — URINALYSIS, ROUTINE W REFLEX MICROSCOPIC
Bilirubin Urine: NEGATIVE
Glucose, UA: NEGATIVE mg/dL
Hgb urine dipstick: NEGATIVE
Ketones, ur: NEGATIVE mg/dL
Leukocytes,Ua: NEGATIVE
Nitrite: NEGATIVE
Protein, ur: NEGATIVE mg/dL
Specific Gravity, Urine: 1.01 (ref 1.005–1.030)
pH: 5 (ref 5.0–8.0)

## 2023-02-13 LAB — CBC
HCT: 35.9 % — ABNORMAL LOW (ref 39.0–52.0)
Hemoglobin: 11.7 g/dL — ABNORMAL LOW (ref 13.0–17.0)
MCH: 30.7 pg (ref 26.0–34.0)
MCHC: 32.6 g/dL (ref 30.0–36.0)
MCV: 94.2 fL (ref 80.0–100.0)
Platelets: 166 10*3/uL (ref 150–400)
RBC: 3.81 MIL/uL — ABNORMAL LOW (ref 4.22–5.81)
RDW: 14.8 % (ref 11.5–15.5)
WBC: 5.3 10*3/uL (ref 4.0–10.5)
nRBC: 0 % (ref 0.0–0.2)

## 2023-02-13 LAB — LIPASE, BLOOD: Lipase: 24 U/L (ref 11–51)

## 2023-02-13 MED ORDER — LIDOCAINE HCL URETHRAL/MUCOSAL 2 % EX GEL
1.0000 | Freq: Once | CUTANEOUS | Status: AC
  Administered 2023-02-13: 1
  Filled 2023-02-13: qty 11

## 2023-02-13 MED ORDER — FLEET ENEMA RE ENEM
1.0000 | ENEMA | Freq: Once | RECTAL | Status: AC
  Administered 2023-02-13: 1 via RECTAL
  Filled 2023-02-13: qty 1

## 2023-02-13 NOTE — Discharge Instructions (Signed)
Please purchase Metamucil fiber to add to your diet.  Please follow directions.  Please also purchase MiraLAX and Colace stool softener and take these daily to help avoid any constipation.  Please read attached guide concerning constipation.  Please follow-up with your PCP.

## 2023-02-13 NOTE — ED Triage Notes (Signed)
Pt states constipation, no BM x 3-4 days  Has not taken any laxatives  Had mag citrate on Sunday and had result but since has not had another

## 2023-02-13 NOTE — ED Provider Notes (Signed)
[place] [facility] Provider Note   CSN: [id number] Arrival date & time: 02/13/23  1806     History  Chief Complaint  Patient presents with   Constipation    Matthew Browning is a 77 y.o. male with medical history of diabetes, GI bleed, hypertension and thyroid disease.  Patient presents to ED for evaluation of constipation.  The patient states that he has not had a good bowel movement since either Friday or Saturday, 3 to 4 days ago.  Reports he does have intermittent issues with constipation.  States he does not drink enough water.  Reports that he tried drinking magnesium citrate 2 days ago and only had very loose "watery" stool as a result.  He denies pain in his rectum, pressure in his rectum.  Denies history of abdominal surgeries.  Denies fevers, nausea or vomiting, abdominal pain at home.  States he tried himself an enema last night however was unable to do so successfully.  Denies dysuria, flank pain, chest pain or shortness of breath.   Constipation Associated symptoms: no abdominal pain, no fever, no nausea and no vomiting        Home Medications Prior to Admission medications   Medication Sig Start Date End Date Taking? Authorizing Provider  amiodarone (PACERONE) 200 MG tablet Take 200 mg by mouth daily.    [provider]  atorvastatin (LIPITOR) 40 MG tablet Take 20 mg by mouth at bedtime.    [provider]  carboxymethylcellulose (REFRESH PLUS) 0.5 % SOLN Place 1 drop into both eyes every 2 (two) hours. 11/20/20   [provider]  carvedilol (COREG) 25 MG tablet Take 12.5 mg by mouth 2 (two) times daily with a meal.    [provider]  Cholecalciferol 50 MCG (2000 UT) TABS Take 2,000 Units by mouth daily. 08/06/20   [provider]  colchicine 0.6 MG tablet Take 0.6-1.2 mg by mouth See admin instructions. Take 1.2mg  once for 1 day, then take 0.6mg  once daily as needed for gout flare 11/13/20    [provider]  cyclobenzaprine (FLEXERIL) 10 MG tablet Take 5-10 mg by mouth 3 (three) times daily as needed for muscle spasms.    [provider]  diclofenac Sodium (VOLTAREN) 1 % GEL Apply 2 g topically 4 (four) times daily as needed (shoulder pain). 04/07/20   [provider]  Docusate Sodium (DSS) 100 MG CAPS Take 100 mg by mouth 2 (two) times daily as needed (hard stools). 11/13/20   [provider]  ferrous sulfate 325 (65 FE) MG tablet Take 325 mg by mouth daily with breakfast.    [provider]  furosemide (LASIX) 40 MG tablet Take 40 mg by mouth daily. 08/06/20   [provider]  gabapentin (NEURONTIN) 300 MG capsule Take 300 mg by mouth 3 (three) times daily as needed (pain). 04/07/20   [provider]  glipiZIDE (GLUCOTROL) 10 MG tablet Take 10 mg by mouth 2 (two) times daily before a meal.    [provider]  levothyroxine (SYNTHROID) 125 MCG tablet Take 125 mcg by mouth daily.    [provider]  pantoprazole (PROTONIX) 40 MG tablet Take 40 mg by mouth daily.    [provider]  pioglitazone (ACTOS) 30 MG tablet Take 15 mg by mouth daily.    [provider]  potassium chloride SA (KLOR-CON) 20 MEQ tablet Take 20 mEq by mouth daily. 08/06/20   [provider]  sertraline (  ZOLOFT) 100 MG tablet Take 100 mg by mouth daily. 05/22/20   [provider]  sildenafil (VIAGRA) 100 MG tablet Take 50 mg by mouth daily as needed for erectile dysfunction. 08/06/20   [provider]  traMADol (ULTRAM) 50 MG tablet Take 1 tablet (50 mg total) by mouth every 6 (six) hours as needed. 11/06/21   Rolan Bucco, MD  White Petrolatum-Mineral Oil (EYE LUBRICANT) OINT Apply 1 application to eye at bedtime.    [provider]  cetirizine (ZYRTEC ALLERGY) 10 MG tablet Take 1 tablet (10 mg total) by mouth daily. 07/21/14 09/10/19  Palumbo, April, MD      Allergies    Patient has no  known allergies.    Review of Systems   Review of Systems  Constitutional:  Negative for fever.  Gastrointestinal:  Positive for constipation. Negative for abdominal distention, abdominal pain, nausea and vomiting.  All other systems reviewed and are negative.   Physical Exam Updated Vital Signs BP 120/73 (BP Location: Right Arm)   Pulse 60   Temp (!) 97.4 F (36.3 C)   Resp 20   Ht 6\' 1"  (1.854 m)   Wt 90 kg   SpO2 99%   BMI 26.18 kg/m  Physical Exam Vitals and nursing note reviewed.  Constitutional:      General: He is not in acute distress.    Appearance: He is well-developed.  HENT:     Head: Normocephalic and atraumatic.     Mouth/Throat:     Mouth: Mucous membranes are moist.     Pharynx: Oropharynx is clear.  Eyes:     Conjunctiva/sclera: Conjunctivae normal.  Cardiovascular:     Rate and Rhythm: Normal rate and regular rhythm.     Heart sounds: No murmur heard. Pulmonary:     Effort: Pulmonary effort is normal. No respiratory distress.     Breath sounds: Normal breath sounds.  Abdominal:     Palpations: Abdomen is soft.     Tenderness: There is no abdominal tenderness.  Musculoskeletal:        General: No swelling.     Cervical back: Neck supple.  Skin:    General: Skin is warm and dry.     Capillary Refill: Capillary refill takes less than 2 seconds.  Neurological:     Mental Status: He is alert.  Psychiatric:        Mood and Affect: Mood normal.     ED Results / Procedures / Treatments   Labs (all labs ordered are listed, but only abnormal results are displayed) Labs Reviewed  CBC - Abnormal; Notable for the following components:      Result Value   RBC 3.81 (*)    Hemoglobin 11.7 (*)    HCT 35.9 (*)    All other components within normal limits  COMPREHENSIVE METABOLIC PANEL - Abnormal; Notable for the following components:   Glucose, Bld 112 (*)    Creatinine, Ser 1.32 (*)    GFR, Estimated 56 (*)    All other components within normal  limits  LIPASE, BLOOD  URINALYSIS, ROUTINE W REFLEX MICROSCOPIC    EKG None  Radiology No results found.  Procedures Procedures   Medications Ordered in ED Medications  sodium phosphate (FLEET) enema 1 enema (1 enema Rectal Given 02/13/23 2053)  lidocaine (XYLOCAINE) 2 % jelly 1 Application (1 Application Other Given 02/13/23 2053)    ED Course/ Medical Decision Making/ A&P   Medical Decision Making Amount and/or Complexity of Data  Reviewed Labs: ordered. Radiology: ordered.  Risk OTC drugs.   77 year old male presents to ED for evaluation.  Please see HPI for further details.  On examination patient has no abdominal tenderness.  He is nontoxic in appearance with reassuring vital signs.  He is afebrile and nontachycardic.  His lung sounds are clear bilaterally and he is not hypoxic.  Will collect labs.  Will provide patient with sodium Fleet enema.  Will collect upright image of abdomen to assess for bowel gas and bowel loops.  Denies history of intra-abdominal surgeries.  CBC without leukocytosis or anemia.  Metabolic panel with a baseline creatinine, no electrolyte derangement, no elevated LFTs.  Urinalysis unremarkable.  Lipase WNL.  X-ray imaging of abdomen has not yet resulted at this time.  Fleet enema given, the patient states that he is having a very large bowel movement.  Patient has had bowel movement here.  He reports he is feeling better.  Will be discharged at this time.  Will follow-up with his PCP.   Final Clinical Impression(s) / ED Diagnoses Final diagnoses:  Constipation, unspecified constipation type    Rx / DC Orders ED Discharge Orders     None         Al Decant, PA-C 02/13/23 2150    Sloan Leiter, DO 02/15/23 [phone]

## 2023-07-08 ENCOUNTER — Emergency Department (HOSPITAL_BASED_OUTPATIENT_CLINIC_OR_DEPARTMENT_OTHER)
Admission: EM | Admit: 2023-07-08 | Discharge: 2023-07-08 | Disposition: A | Discharge status: Home / Self Care | Attending: Emergency Medicine | Admitting: Emergency Medicine

## 2023-07-08 ENCOUNTER — Encounter (HOSPITAL_BASED_OUTPATIENT_CLINIC_OR_DEPARTMENT_OTHER): Payer: Self-pay | Admitting: Emergency Medicine

## 2023-07-08 DIAGNOSIS — X58XXXA Exposure to other specified factors, initial encounter: Secondary | ICD-10-CM | POA: Insufficient documentation

## 2023-07-08 DIAGNOSIS — Z950 Presence of cardiac pacemaker: Secondary | ICD-10-CM | POA: Diagnosis not present

## 2023-07-08 DIAGNOSIS — S161XXA Strain of muscle, fascia and tendon at neck level, initial encounter: Secondary | ICD-10-CM | POA: Insufficient documentation

## 2023-07-08 DIAGNOSIS — I10 Essential (primary) hypertension: Secondary | ICD-10-CM | POA: Diagnosis not present

## 2023-07-08 DIAGNOSIS — M542 Cervicalgia: Secondary | ICD-10-CM | POA: Diagnosis present

## 2023-07-08 DIAGNOSIS — Z7984 Long term (current) use of oral hypoglycemic drugs: Secondary | ICD-10-CM | POA: Diagnosis not present

## 2023-07-08 DIAGNOSIS — E119 Type 2 diabetes mellitus without complications: Secondary | ICD-10-CM | POA: Diagnosis not present

## 2023-07-08 MED ORDER — LIDOCAINE 5 % EX PTCH: 1.0000 | MEDICATED_PATCH | CUTANEOUS | 0 refills | Status: AC

## 2023-07-08 MED ORDER — METHOCARBAMOL 500 MG PO TABS: 500.0000 mg | ORAL_TABLET | Freq: Every day | ORAL | 0 refills | Status: AC

## 2023-07-08 MED ORDER — LIDOCAINE 5 % EX PTCH
1.0000 | MEDICATED_PATCH | CUTANEOUS | Status: DC
  Administered 2023-07-08: 1 via TRANSDERMAL
  Filled 2023-07-08: qty 1

## 2023-07-08 NOTE — ED Triage Notes (Signed)
 Pt c/o RT side neck pain x 3d; no injury

## 2023-07-08 NOTE — ED Provider Notes (Signed)
 [place] [facility] Provider Note   CSN: [id number] Arrival date & time: 07/08/23  1727     History  Chief Complaint  Patient presents with   Neck Pain    Matthew Browning is a 78 y.o. male with past medical history significant for hypertension, diabetes, high cholesterol, recent pacemaker insertion of the left, history of GI bleed who presents with concern for some right sided neck pain for 3 days.  He reports that he feels like he slept on it wrong.  He is trying Tylenol at home for pain without significant relief.  He rates the pain 8/10.  He denies any numbness, tingling, weakness.  No sensory changes of upper or lower extremities.   Neck Pain      Home Medications Prior to Admission medications   Medication Sig Start Date End Date Taking? Authorizing Provider  amiodarone (PACERONE) 200 MG tablet Take 200 mg by mouth daily.    [provider]  atorvastatin (LIPITOR) 40 MG tablet Take 20 mg by mouth at bedtime.    [provider]  carboxymethylcellulose (REFRESH PLUS) 0.5 % SOLN Place 1 drop into both eyes every 2 (two) hours. 11/20/20   [provider]  carvedilol (COREG) 25 MG tablet Take 12.5 mg by mouth 2 (two) times daily with a meal.    [provider]  Cholecalciferol 50 MCG (2000 UT) TABS Take 2,000 Units by mouth daily. 08/06/20   [provider]  colchicine 0.6 MG tablet Take 0.6-1.2 mg by mouth See admin instructions. Take 1.2mg  once for 1 day, then take 0.6mg  once daily as needed for gout flare 11/13/20   [provider]  cyclobenzaprine (FLEXERIL) 10 MG tablet Take 5-10 mg by mouth 3 (three) times daily as needed for muscle spasms.    [provider]  diclofenac Sodium (VOLTAREN) 1 % GEL Apply 2 g topically 4 (four) times daily as needed (shoulder pain). 04/07/20   [provider]  Docusate Sodium (DSS) 100 MG CAPS Take 100 mg by mouth 2 (two) times daily as needed  (hard stools). 11/13/20   [provider]  ferrous sulfate 325 (65 FE) MG tablet Take 325 mg by mouth daily with breakfast.    [provider]  furosemide (LASIX) 40 MG tablet Take 40 mg by mouth daily. 08/06/20   [provider]  gabapentin (NEURONTIN) 300 MG capsule Take 300 mg by mouth 3 (three) times daily as needed (pain). 04/07/20   [provider]  glipiZIDE (GLUCOTROL) 10 MG tablet Take 10 mg by mouth 2 (two) times daily before a meal.    [provider]  levothyroxine (SYNTHROID) 125 MCG tablet Take 125 mcg by mouth daily.    [provider]  pantoprazole (PROTONIX) 40 MG tablet Take 40 mg by mouth daily.    [provider]  pioglitazone (ACTOS) 30 MG tablet Take 15 mg by mouth daily.    [provider]  potassium chloride SA (KLOR-CON) 20 MEQ tablet Take 20 mEq by mouth daily. 08/06/20   [provider]  sertraline (ZOLOFT) 100 MG tablet Take 100 mg by mouth daily. 05/22/20   [provider]  sildenafil (VIAGRA) 100 MG tablet Take 50 mg by mouth daily as needed for erectile dysfunction. 08/06/20   [provider]  traMADol (ULTRAM) 50 MG tablet Take 1 tablet (50 mg total) by mouth every 6 (six) hours as needed. 11/06/21   Hershel Los, MD  Caryle Class  Oil (EYE LUBRICANT) OINT Apply 1 application to eye at bedtime.    [provider]  cetirizine (ZYRTEC ALLERGY) 10 MG tablet Take 1 tablet (10 mg total) by mouth daily. 07/21/14 09/10/19  Palumbo, April, MD      Allergies    Ace inhibitors    Review of Systems   Review of Systems  Musculoskeletal:  Positive for neck pain.  All other systems reviewed and are negative.   Physical Exam Updated Vital Signs BP 126/62 (BP Location: Right Arm)   Pulse 72   Temp (!) 97.4 F (36.3 C)   Resp (!) 24   Ht 6\' 1"  (1.854 m)   Wt 90 kg   SpO2 98%   BMI 26.18 kg/m  Physical Exam Vitals and nursing note reviewed.   Constitutional:      General: He is not in acute distress.    Appearance: Normal appearance.  HENT:     Head: Normocephalic and atraumatic.  Eyes:     General:        Right eye: No discharge.        Left eye: No discharge.  Cardiovascular:     Rate and Rhythm: Normal rate and regular rhythm.  Pulmonary:     Effort: Pulmonary effort is normal. No respiratory distress.     Comments: Mild tachypnea on arrival is resolved by the time this provider evaluated the patient, he has normal respiratory rate, normal effort, no wheezing, rhonchi, stridor, rales. Musculoskeletal:        General: No deformity.     Comments: Focal tenderness in right paraspinous muscles, trapezius chain, intact and 5/5 of bilateral upper and lower extremities, normal sensation throughout.  No midline spinal tenderness in the C-spine.  No palpable thrill of carotid bilaterally, no carotid bruit.  Skin:    General: Skin is warm and dry.  Neurological:     Mental Status: He is alert and oriented to person, place, and time.  Psychiatric:        Mood and Affect: Mood normal.        Behavior: Behavior normal.     ED Results / Procedures / Treatments   Labs (all labs ordered are listed, but only abnormal results are displayed) Labs Reviewed - No data to display  EKG None  Radiology No results found.  Procedures Procedures    Medications Ordered in ED Medications - No data to display  ED Course/ Medical Decision Making/ A&P                                 Medical Decision Making  This is a well-appearing 78 year old male with past medical history seen for hypertension, diabetes, hyperlipidemia, pacemaker who presents concern for right sided neck pain after thinking that he may have slept on it wrong.  Not improved with Tylenol.  He has no focal neurologic deficits, he is neurovascularly intact throughout on my exam.  Findings are consistent with cervical strain with palpable tenderness to the trapezius.   Normal range of motion of neck with some pain with lateral rotation towards the right.  Normal sensation throughout upper extremities.  Will plan for patient to continue Tylenol, lidocaine patches, muscle relaxant given, counseled only for nighttime use to prevent any falls, encouraged follow-up with PCP.  Patient stable for discharge with return precautions given. Final Clinical Impression(s) / ED Diagnoses Final diagnoses:  None    Rx / DC  Orders ED Discharge Orders     None         Stefan Edge 07/08/23 1859    Scarlette Currier, MD 07/11/23 1328

## 2023-07-08 NOTE — Discharge Instructions (Addendum)
 Please use Tylenol for pain.  You may use 1000 mg of Tylenol every 6 hours.  Not to exceed 4 g of Tylenol within 24 hours.  You can use the lidocaine patches in addition to the Tylenol above, looking back at your records it looks like you have had some bleeding in the stomach before and so I would avoid taking NSAIDs.  You can use the muscle relaxant that I prescribed for additional breakthrough pain, but I would only take it at night because it may make you drowsy.

## 2023-10-03 ENCOUNTER — Other Ambulatory Visit: Payer: Self-pay

## 2023-10-03 ENCOUNTER — Emergency Department (HOSPITAL_BASED_OUTPATIENT_CLINIC_OR_DEPARTMENT_OTHER)
Admission: EM | Admit: 2023-10-03 | Discharge: 2023-10-03 | Disposition: A | Discharge status: Home / Self Care | Source: Skilled Nursing Facility | Attending: Emergency Medicine | Admitting: Emergency Medicine

## 2023-10-03 ENCOUNTER — Emergency Department (HOSPITAL_BASED_OUTPATIENT_CLINIC_OR_DEPARTMENT_OTHER)

## 2023-10-03 ENCOUNTER — Encounter (HOSPITAL_BASED_OUTPATIENT_CLINIC_OR_DEPARTMENT_OTHER): Payer: Self-pay

## 2023-10-03 DIAGNOSIS — Z79899 Other long term (current) drug therapy: Secondary | ICD-10-CM | POA: Diagnosis not present

## 2023-10-03 DIAGNOSIS — E119 Type 2 diabetes mellitus without complications: Secondary | ICD-10-CM | POA: Diagnosis not present

## 2023-10-03 DIAGNOSIS — I10 Essential (primary) hypertension: Secondary | ICD-10-CM | POA: Diagnosis not present

## 2023-10-03 DIAGNOSIS — Z950 Presence of cardiac pacemaker: Secondary | ICD-10-CM | POA: Diagnosis not present

## 2023-10-03 DIAGNOSIS — K5900 Constipation, unspecified: Secondary | ICD-10-CM | POA: Diagnosis present

## 2023-10-03 DIAGNOSIS — R1084 Generalized abdominal pain: Secondary | ICD-10-CM

## 2023-10-03 LAB — COMPREHENSIVE METABOLIC PANEL WITH GFR
ALT: 8 U/L (ref 0–44)
AST: 20 U/L (ref 15–41)
Albumin: 4 g/dL (ref 3.5–5.0)
Alkaline Phosphatase: 73 U/L (ref 38–126)
Anion gap: 8 (ref 5–15)
BUN: 16 mg/dL (ref 8–23)
CO2: 27 mmol/L (ref 22–32)
Calcium: 9 mg/dL (ref 8.9–10.3)
Chloride: 102 mmol/L (ref 98–111)
Creatinine, Ser: 1.34 mg/dL — ABNORMAL HIGH (ref 0.61–1.24)
GFR, Estimated: 54 mL/min — ABNORMAL LOW (ref >60)
Glucose, Bld: 105 mg/dL — ABNORMAL HIGH (ref 70–99)
Potassium: 4.5 mmol/L (ref 3.5–5.1)
Sodium: 137 mmol/L (ref 135–145)
Total Bilirubin: 0.4 mg/dL (ref 0.0–1.2)
Total Protein: 7 g/dL (ref 6.5–8.1)

## 2023-10-03 LAB — URINALYSIS, W/ REFLEX TO CULTURE (INFECTION SUSPECTED)
Bacteria, UA: NONE SEEN
Bilirubin Urine: NEGATIVE
Glucose, UA: NEGATIVE mg/dL
Hgb urine dipstick: NEGATIVE
Ketones, ur: NEGATIVE mg/dL
Leukocytes,Ua: NEGATIVE
Nitrite: NEGATIVE
Protein, ur: NEGATIVE mg/dL
RBC / HPF: NONE SEEN RBC/hpf (ref 0–5)
Specific Gravity, Urine: 1.01 (ref 1.005–1.030)
Squamous Epithelial / HPF: NONE SEEN /HPF (ref 0–5)
WBC, UA: NONE SEEN WBC/hpf (ref 0–5)
pH: 7 (ref 5.0–8.0)

## 2023-10-03 LAB — CBC WITH DIFFERENTIAL/PLATELET
Abs Immature Granulocytes: 0.01 K/uL (ref 0.00–0.07)
Basophils Absolute: 0 K/uL (ref 0.0–0.1)
Basophils Relative: 0 %
Eosinophils Absolute: 0.1 K/uL (ref 0.0–0.5)
Eosinophils Relative: 2 %
HCT: 35.5 % — ABNORMAL LOW (ref 39.0–52.0)
Hemoglobin: 11.6 g/dL — ABNORMAL LOW (ref 13.0–17.0)
Immature Granulocytes: 0 %
Lymphocytes Relative: 30 %
Lymphs Abs: 1.2 K/uL (ref 0.7–4.0)
MCH: 31.2 pg (ref 26.0–34.0)
MCHC: 32.7 g/dL (ref 30.0–36.0)
MCV: 95.4 fL (ref 80.0–100.0)
Monocytes Absolute: 0.4 K/uL (ref 0.1–1.0)
Monocytes Relative: 10 %
Neutro Abs: 2.4 K/uL (ref 1.7–7.7)
Neutrophils Relative %: 58 %
Platelets: 179 K/uL (ref 150–400)
RBC: 3.72 MIL/uL — ABNORMAL LOW (ref 4.22–5.81)
RDW: 15.4 % (ref 11.5–15.5)
WBC: 4.2 K/uL (ref 4.0–10.5)
nRBC: 0 % (ref 0.0–0.2)

## 2023-10-03 LAB — CBG MONITORING, ED: Glucose-Capillary: 77 mg/dL (ref 70–99)

## 2023-10-03 LAB — LIPASE, BLOOD: Lipase: 15 U/L (ref 11–51)

## 2023-10-03 LAB — LACTIC ACID, PLASMA: Lactic Acid, Venous: 0.9 mmol/L (ref 0.5–1.9)

## 2023-10-03 MED ORDER — SODIUM CHLORIDE 0.9 % IV BOLUS
500.0000 mL | Freq: Once | INTRAVENOUS | Status: AC
  Administered 2023-10-03: 500 mL via INTRAVENOUS

## 2023-10-03 MED ORDER — IOHEXOL 300 MG/ML  SOLN
100.0000 mL | Freq: Once | INTRAMUSCULAR | Status: AC | PRN
  Administered 2023-10-03: 100 mL via INTRAVENOUS

## 2023-10-03 NOTE — ED Triage Notes (Signed)
 Pt presents with complaints of constipation and bloating. Last BM was 3 days ago. Took Miralax but no relief.

## 2023-10-03 NOTE — Discharge Instructions (Signed)
 Your history, exam, workup today seem consistent with some mild constipation but no obstruction.  Your CT scan did not show obstruction and was otherwise reassuring.  We had a conversation about the findings and recommend follow-up with your primary doctor to discuss your bony changes but otherwise no concerning finding on your workup.  We feel you are safe for discharge home.  Please increase your fluids and rest and stay hydrated.  Please follow-up with your primary doctor.  If any symptoms change or worsen acutely, return to the nearest Emergency Department.

## 2023-10-03 NOTE — ED Provider Notes (Signed)
  EMERGENCY DEPARTMENT AT [facility] Provider Note   CSN: [id number] Arrival date & time: 10/03/23  1024     Patient presents with: Constipation   Matthew Browning is a 78 y.o. male.   The history is provided by the patient and medical records. No language interpreter was used.  Constipation Severity:  Severe Time since last bowel movement:  1 week Timing:  Constant Progression:  Waxing and waning Chronicity:  New Stool description:  Small and watery Relieved by:  Nothing Worsened by:  Nothing Ineffective treatments:  Miralax Associated symptoms: abdominal pain   Associated symptoms: no back pain, no diarrhea, no dysuria, no fever, no nausea and no vomiting   Risk factors: no hx of abdominal surgery        Prior to Admission medications   Medication Sig Start Date End Date Taking? Authorizing Provider  amiodarone (PACERONE) 200 MG tablet Take 200 mg by mouth daily.    [provider]  atorvastatin (LIPITOR) 40 MG tablet Take 20 mg by mouth at bedtime.    [provider]  carboxymethylcellulose (REFRESH PLUS) 0.5 % SOLN Place 1 drop into both eyes every 2 (two) hours. 11/20/20   [provider]  carvedilol (COREG) 25 MG tablet Take 12.5 mg by mouth 2 (two) times daily with a meal.    [provider]  Cholecalciferol 50 MCG (2000 UT) TABS Take 2,000 Units by mouth daily. 08/06/20   [provider]  colchicine 0.6 MG tablet Take 0.6-1.2 mg by mouth See admin instructions. Take 1.2mg  once for 1 day, then take 0.6mg  once daily as needed for gout flare 11/13/20   [provider]  cyclobenzaprine (FLEXERIL) 10 MG tablet Take 5-10 mg by mouth 3 (three) times daily as needed for muscle spasms.    [provider]  diclofenac Sodium (VOLTAREN) 1 % GEL Apply 2 g topically 4 (four) times daily as needed (shoulder pain). 04/07/20   [provider]  Docusate Sodium (DSS) 100 MG CAPS Take 100 mg by mouth 2  (two) times daily as needed (hard stools). 11/13/20   [provider]  ferrous sulfate 325 (65 FE) MG tablet Take 325 mg by mouth daily with breakfast.    [provider]  furosemide (LASIX) 40 MG tablet Take 40 mg by mouth daily. 08/06/20   [provider]  gabapentin (NEURONTIN) 300 MG capsule Take 300 mg by mouth 3 (three) times daily as needed (pain). 04/07/20   [provider]  glipiZIDE (GLUCOTROL) 10 MG tablet Take 10 mg by mouth 2 (two) times daily before a meal.    [provider]  levothyroxine (SYNTHROID) 125 MCG tablet Take 125 mcg by mouth daily.    [provider]  lidocaine  (LIDODERM ) 5 % Place 1 patch onto the skin daily. Remove & Discard patch within 12 hours or as directed by MD 07/08/23   Prosperi, Sherlean H, PA-C  methocarbamol  (ROBAXIN ) 500 MG tablet Take 1 tablet (500 mg total) by mouth at bedtime. 07/08/23   Prosperi, Christian H, PA-C  pantoprazole  (PROTONIX ) 40 MG tablet Take 40 mg by mouth daily.    [provider]  pioglitazone (ACTOS) 30 MG tablet Take 15 mg by mouth daily.    [provider]  potassium chloride SA (KLOR-CON) 20 MEQ tablet Take 20 mEq by mouth daily. 08/06/20   [provider]  sertraline (ZOLOFT) 100 MG tablet Take 100 mg by mouth daily. 05/22/20   [provider]  sildenafil (VIAGRA) 100 MG tablet Take 50 mg by mouth daily as needed for erectile dysfunction. 08/06/20   [provider]  traMADol  (ULTRAM ) 50 MG tablet Take 1 tablet (50 mg total) by mouth every 6 (six) hours as needed. 11/06/21   Lenor Hollering, MD  White Petrolatum-Mineral Oil (EYE LUBRICANT) OINT Apply 1 application to eye at bedtime.    [provider]  cetirizine  (ZYRTEC  ALLERGY) 10 MG tablet Take 1 tablet (10 mg total) by mouth daily. 07/21/14 09/10/19  Palumbo, April, MD    Allergies: Ace inhibitors    Review of Systems  Constitutional:  Negative for chills, fatigue and fever.   HENT:  Negative for congestion.   Respiratory:  Negative for cough, chest tightness, shortness of breath and wheezing.   Cardiovascular:  Negative for chest pain and palpitations.  Gastrointestinal:  Positive for abdominal distention, abdominal pain and constipation. Negative for diarrhea, nausea and vomiting.  Genitourinary:  Negative for dysuria, flank pain and frequency.  Musculoskeletal:  Negative for back pain, neck pain and neck stiffness.  Skin:  Negative for rash and wound.  Neurological:  Negative for headaches.  Psychiatric/Behavioral:  Negative for agitation and confusion.   All other systems reviewed and are negative.   Updated Vital Signs BP 102/64   Pulse 72   Temp (!) 97.5 F (36.4 C)   Resp 14   SpO2 98%   Physical Exam Vitals and nursing note reviewed.  Constitutional:      General: He is not in acute distress.    Appearance: He is well-developed. He is not ill-appearing, toxic-appearing or diaphoretic.  HENT:     Head: Normocephalic and atraumatic.     Nose: No congestion or rhinorrhea.     Mouth/Throat:     Mouth: Mucous membranes are dry.     Pharynx: No oropharyngeal exudate or posterior oropharyngeal erythema.  Eyes:     Extraocular Movements: Extraocular movements intact.     Conjunctiva/sclera: Conjunctivae normal.     Pupils: Pupils are equal, round, and reactive to light.  Cardiovascular:     Rate and Rhythm: Normal rate and regular rhythm.     Heart sounds: No murmur heard. Pulmonary:     Effort: Pulmonary effort is normal. No respiratory distress.     Breath sounds: Normal breath sounds.  Abdominal:     General: There is distension.     Palpations: Abdomen is soft.     Tenderness: There is abdominal tenderness. There is no right CVA tenderness, left CVA tenderness, guarding or rebound.  Musculoskeletal:        General: No swelling or tenderness.     Cervical back: Neck supple.     Right lower leg: No edema.     Left lower leg: No edema.   Skin:    General: Skin is warm and dry.     Capillary Refill: Capillary refill takes less than 2 seconds.     Coloration: Skin is not pale.     Findings: No erythema or rash.  Neurological:     General: No focal deficit present.     Mental Status: He is alert.  Psychiatric:        Mood and Affect: Mood normal.     (all labs ordered are listed, but only abnormal results are displayed) Labs Reviewed  CBC WITH DIFFERENTIAL/PLATELET - Abnormal; Notable for the following components:      Result Value   RBC 3.72 (*)    Hemoglobin 11.6 (*)  HCT 35.5 (*)    All other components within normal limits  COMPREHENSIVE METABOLIC PANEL WITH GFR - Abnormal; Notable for the following components:   Glucose, Bld 105 (*)    Creatinine, Ser 1.34 (*)    GFR, Estimated 54 (*)    All other components within normal limits  LACTIC ACID, PLASMA  LIPASE, BLOOD  URINALYSIS, W/ REFLEX TO CULTURE (INFECTION SUSPECTED)  LACTIC ACID, PLASMA  CBG MONITORING, ED    EKG: None  Radiology: CT ABDOMEN PELVIS W CONTRAST Result Date: 10/03/2023 CLINICAL DATA:  Bowel obstruction. EXAM: CT ABDOMEN AND PELVIS WITH CONTRAST TECHNIQUE: Multidetector CT imaging of the abdomen and pelvis was performed using the standard protocol following bolus administration of intravenous contrast. RADIATION DOSE REDUCTION: This exam was performed according to the departmental dose-optimization program which includes automated exposure control, adjustment of the mA and/or kV according to patient size and/or use of iterative reconstruction technique. CONTRAST:  OMNIPAQUE  IOHEXOL  300 MG/ML  SOLN COMPARISON:  CT chest abdomen pelvis dated 11/06/2021. FINDINGS: Lower chest: The visualized lung bases are clear. There is coronary vascular calcification. Cardiac pacemaker wires. No intra-abdominal free air or free fluid. Hepatobiliary: The liver is unremarkable. No biliary dilatation. The gallbladder is unremarkable with Pancreas:  Unremarkable. No pancreatic ductal dilatation or surrounding inflammatory changes. Spleen: Normal in size without focal abnormality. Adrenals/Urinary Tract: The adrenal glands are unremarkable. There is no hydronephrosis on either side. There is symmetric enhancement and excretion of contrast by both kidneys. Small left renal upper pole cyst. The visualized ureters and urinary bladder appear unremarkable. Stomach/Bowel: There is no bowel obstruction or active inflammation. The appendix is normal. Vascular/Lymphatic: Mild aortoiliac atherosclerotic disease. The IVC is unremarkable. No portal venous gas. There is no adenopathy. Reproductive: The prostate is grossly unremarkable no suspicious adnexal masses. Other: None Musculoskeletal: Osteopenia with degenerative changes of the spine. Scattered small osseous lucencies may be secondary to demineralization. Multiple myeloma is not excluded. No acute osseous pathology. IMPRESSION: 1. No acute intra-abdominal or pelvic pathology. No bowel obstruction. Normal appendix. 2.  Aortic Atherosclerosis (ICD10-I70.0). Electronically Signed   By: Vanetta Chou M.D.   On: 10/03/2023 14:51     Procedures   Medications Ordered in the ED  sodium chloride  0.9 % bolus 500 mL (0 mLs Intravenous Stopped 10/03/23 1232)  iohexol  (OMNIPAQUE ) 300 MG/ML solution 100 mL (100 mLs Intravenous Contrast Given 10/03/23 1334)                                    Medical Decision Making Amount and/or Complexity of Data Reviewed Labs: ordered. Radiology: ordered.  Risk Prescription drug management.    Hamzeh Tall is a 78 y.o. male with past medical history significant for hypertension, hypercholesterolemia, diabetes, thyroid disease, pacemaker, and previous GI bleeding who presents with constipation and abdominal pain and bloating.  According to patient, his last bowel movement that was somewhat normal was over a week ago.  He reports he has had some small loose watery stools  most recently couple days ago but he reports this is not normal.  He reports 8 out of 10 abdominal pain that is all over his abdomen and it feels distended.  He is still passing some gas.  He reports no urinary changes and no flank pain or back pain.  He denies any fevers, chills or cough.  He said he has taken some MiraLAX without significant relief.  On  my exam, abdomen is tender and somewhat distended.  I did hear some bowel sounds.  Back and flanks nontender.  Lungs clear.  Patient has some dry mucous membranes and thinks he could be dehydrated.  Will give some fluids.  Legs are nontender and nonedematous.  He denied any groin pain.  Due to his age, he does have abdominal pain, tenderness, we will get a CT ab pelvis to look for obstruction or partial structure and or other abnormality.  Will get screening labs and urinalysis.  Will give him some fluids given the dry mouth and my suspicion for dehydration.  Anticipate reassessment after workup to determine disposition.  The scan does not show obstruction or other acute abnormality.  We did discuss that there was some weak bones seen on his imaging and is followed with his PCP for this.  Otherwise workup reassuring and as there is no obstruction and not large stool burden we feel he is safe for discharge home.  Patient is feeling better after some fluids.  Patient was discharged for outpatient follow-up and understood return precautions.  He no other questions or concerns and was discharged in good condition.      Final diagnoses:  Generalized abdominal pain  Constipation, unspecified constipation type    ED Discharge Orders     None       Clinical Impression: 1. Generalized abdominal pain   2. Constipation, unspecified constipation type     Disposition: Discharge  Condition: Good  I have discussed the results, Dx and Tx plan with the pt(& family if present). He/she/they expressed understanding and agree(s) with the plan.  Discharge instructions discussed at great length. Strict return precautions discussed and pt &/or family have verbalized understanding of the instructions. No further questions at time of discharge.    New Prescriptions   No medications on file    Follow Up: [facility] [address] [place] [address] [phone]     [facility] Emergency Department at [facility] [address] [place]  [address] [phone]       Robie Oats, Lonni PARAS, MD 10/03/23 1505

## 2023-11-06 ENCOUNTER — Other Ambulatory Visit: Payer: Self-pay

## 2023-11-06 ENCOUNTER — Emergency Department (HOSPITAL_BASED_OUTPATIENT_CLINIC_OR_DEPARTMENT_OTHER)
Admission: EM | Admit: 2023-11-06 | Discharge: 2023-11-06 | Discharge status: Against Medical Advice | Attending: Emergency Medicine | Admitting: Emergency Medicine

## 2023-11-06 ENCOUNTER — Encounter (HOSPITAL_BASED_OUTPATIENT_CLINIC_OR_DEPARTMENT_OTHER): Payer: Self-pay | Admitting: Emergency Medicine

## 2023-11-06 DIAGNOSIS — K5900 Constipation, unspecified: Secondary | ICD-10-CM | POA: Diagnosis not present

## 2023-11-06 DIAGNOSIS — Z5321 Procedure and treatment not carried out due to patient leaving prior to being seen by health care provider: Secondary | ICD-10-CM | POA: Diagnosis not present

## 2023-11-06 DIAGNOSIS — R1084 Generalized abdominal pain: Secondary | ICD-10-CM | POA: Diagnosis present

## 2023-11-06 LAB — CBC
HCT: 34.8 % — ABNORMAL LOW (ref 39.0–52.0)
Hemoglobin: 11.4 g/dL — ABNORMAL LOW (ref 13.0–17.0)
MCH: 31.7 pg (ref 26.0–34.0)
MCHC: 32.8 g/dL (ref 30.0–36.0)
MCV: 96.7 fL (ref 80.0–100.0)
Platelets: 158 K/uL (ref 150–400)
RBC: 3.6 MIL/uL — ABNORMAL LOW (ref 4.22–5.81)
RDW: 15.1 % (ref 11.5–15.5)
WBC: 3.6 K/uL — ABNORMAL LOW (ref 4.0–10.5)
nRBC: 0 % (ref 0.0–0.2)

## 2023-11-06 LAB — COMPREHENSIVE METABOLIC PANEL WITH GFR
ALT: 8 U/L (ref 0–44)
AST: 21 U/L (ref 15–41)
Albumin: 4 g/dL (ref 3.5–5.0)
Alkaline Phosphatase: 68 U/L (ref 38–126)
Anion gap: 9 (ref 5–15)
BUN: 17 mg/dL (ref 8–23)
CO2: 28 mmol/L (ref 22–32)
Calcium: 9.2 mg/dL (ref 8.9–10.3)
Chloride: 104 mmol/L (ref 98–111)
Creatinine, Ser: 1.4 mg/dL — ABNORMAL HIGH (ref 0.61–1.24)
GFR, Estimated: 51 mL/min — ABNORMAL LOW (ref >60)
Glucose, Bld: 113 mg/dL — ABNORMAL HIGH (ref 70–99)
Potassium: 5.2 mmol/L — ABNORMAL HIGH (ref 3.5–5.1)
Sodium: 141 mmol/L (ref 135–145)
Total Bilirubin: 0.4 mg/dL (ref 0.0–1.2)
Total Protein: 7.1 g/dL (ref 6.5–8.1)

## 2023-11-06 LAB — LIPASE, BLOOD: Lipase: 16 U/L (ref 11–51)

## 2023-11-06 NOTE — ED Triage Notes (Signed)
 Pt c/o constipation. LBM 2-3 days ago. C/o generalized abd discomfort.

## 2023-11-06 NOTE — ED Notes (Signed)
 After triage pt was directed to wait in lobby for room, once pt realized he would not be roomed immediately, pt stated he was going to go home and try OTC remedies.  Pt visualized ambulating out of ED, NAD>

## 2023-11-08 ENCOUNTER — Ambulatory Visit (HOSPITAL_COMMUNITY): Payer: Self-pay

## 2024-04-23 IMAGING — US US EXTREM LOW VENOUS*R*
1 series · 14 of 24 positions shown · non-contrast
Comparison: None Available.

CLINICAL DATA: Edema

EXAM:
Right LOWER EXTREMITY VENOUS DOPPLER ULTRASOUND
TECHNIQUE: Gray-scale sonography with compression, as well as color and duplex
ultrasound, were performed to evaluate the deep venous system(s)
from the level of the common femoral vein through the popliteal and
proximal calf veins.

[Series 1: us extrem low venous*right* · 14 of 35 slices shown]
[im 1/35]
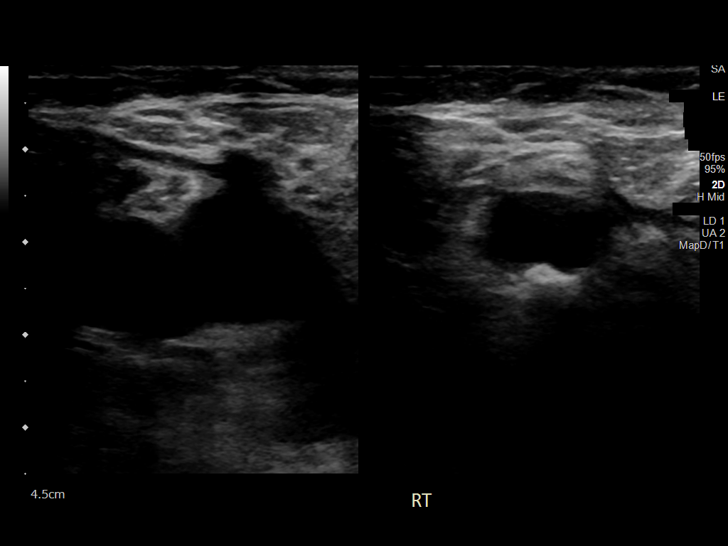
[im 3/35]
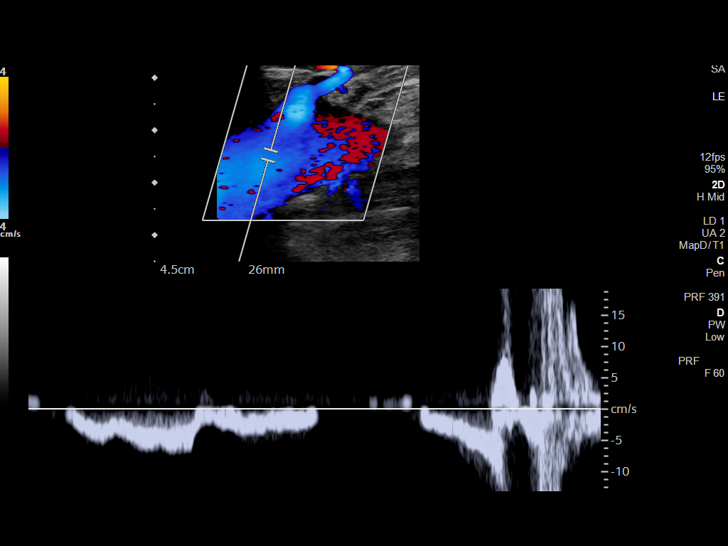
[im 6/35]
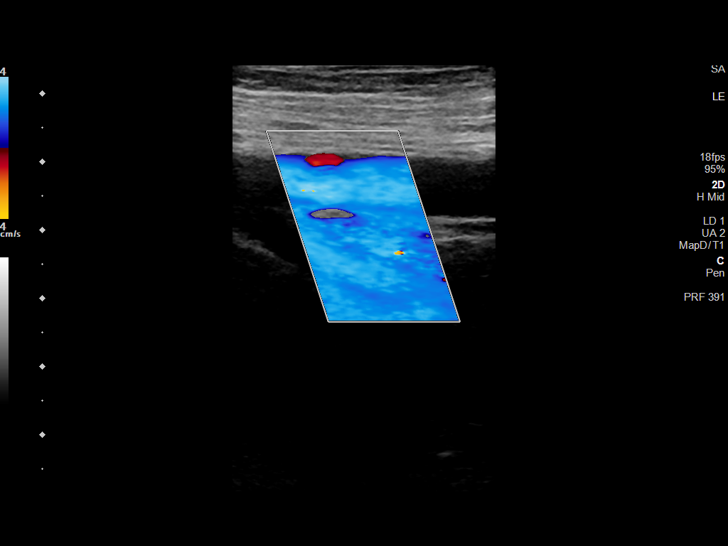
[im 9/35]
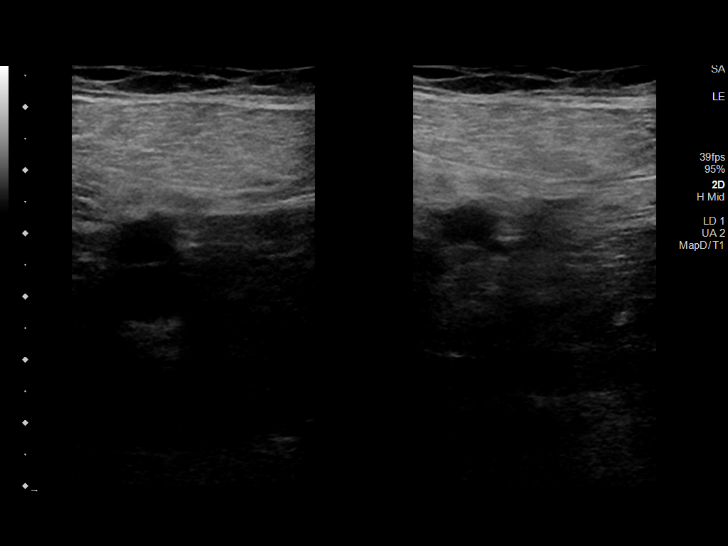
[im 11/35]
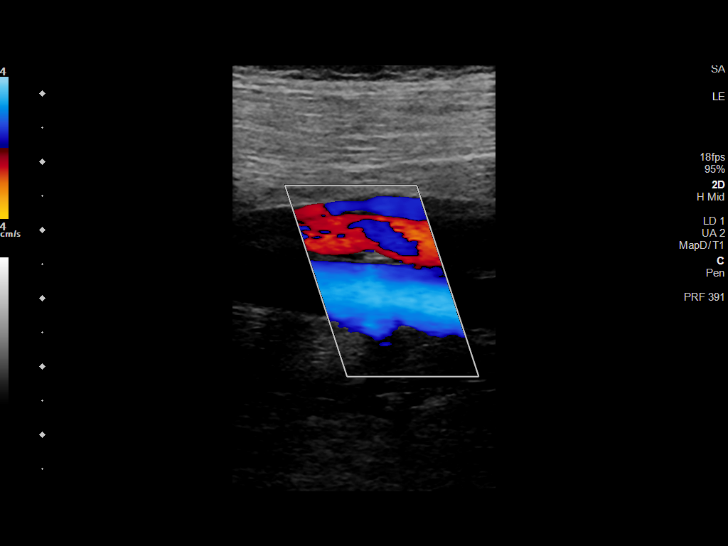
[im 14/35]
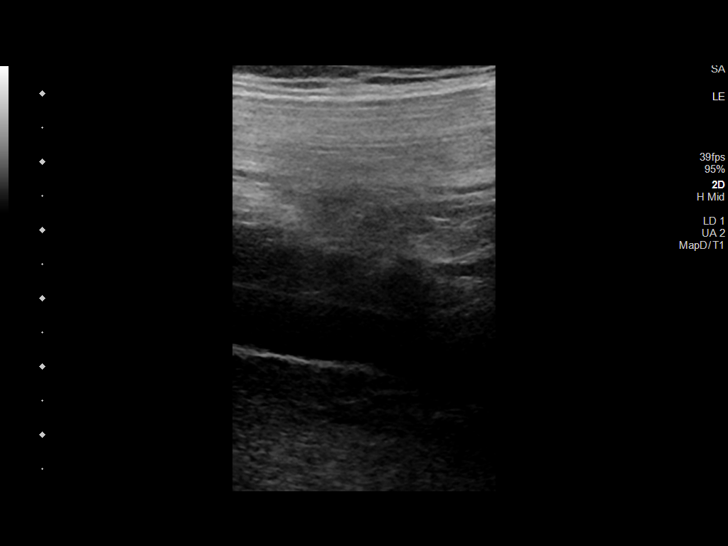
[im 17/35]
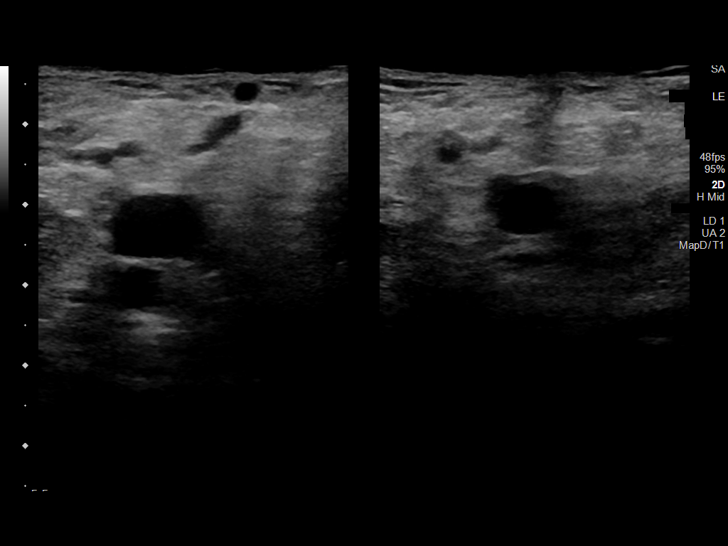
[im 18/35]
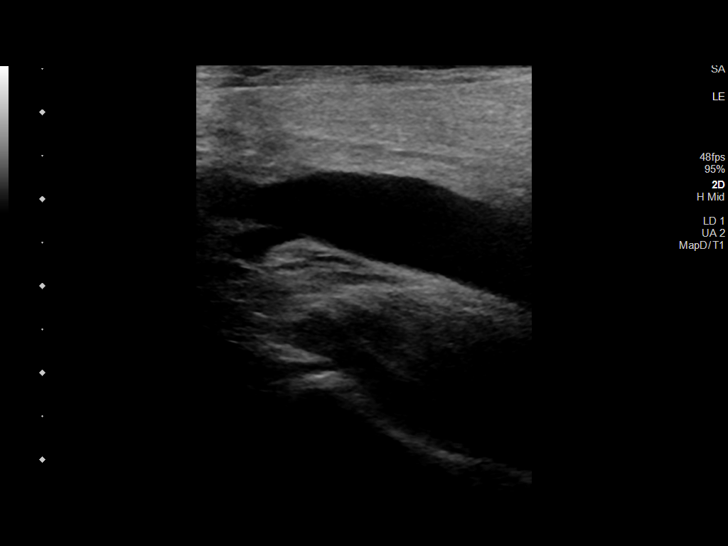
[im 21/35]
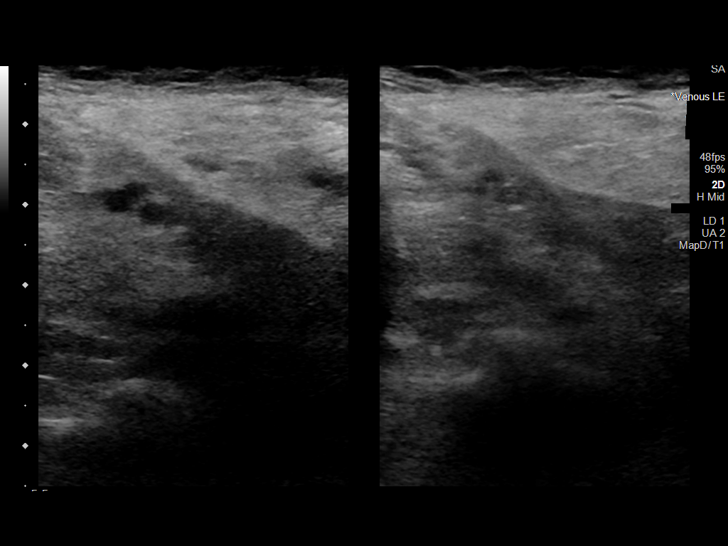
[im 24/35]
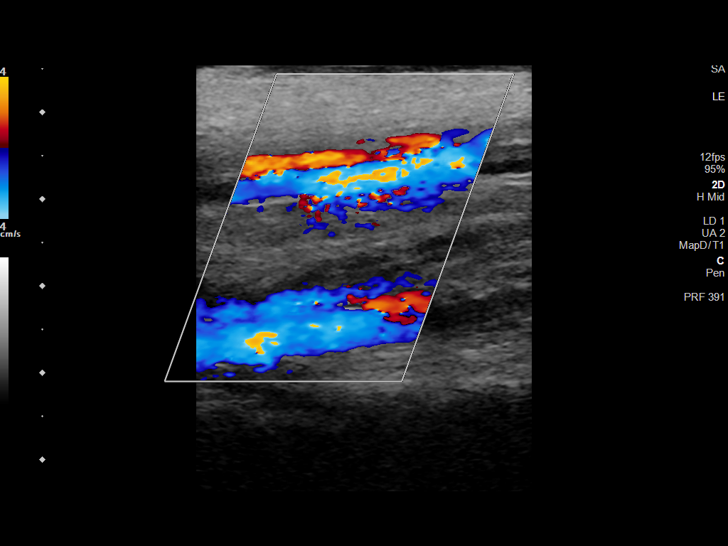
[im 27/35]
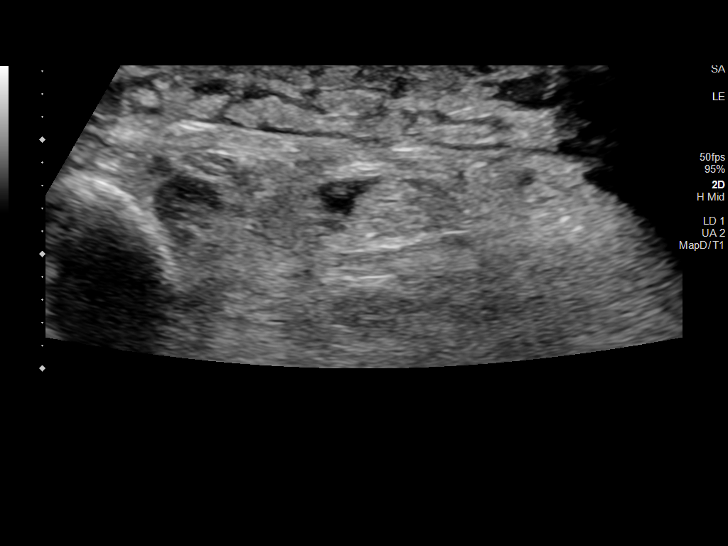
[im 29/35]
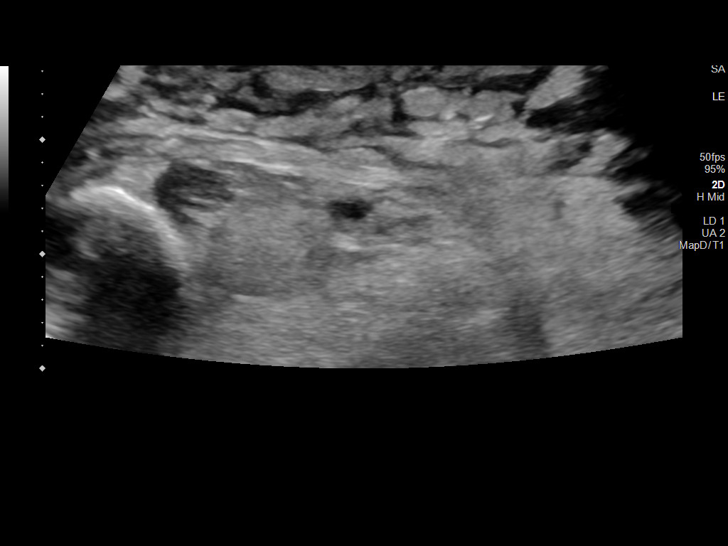
[im 32/35]
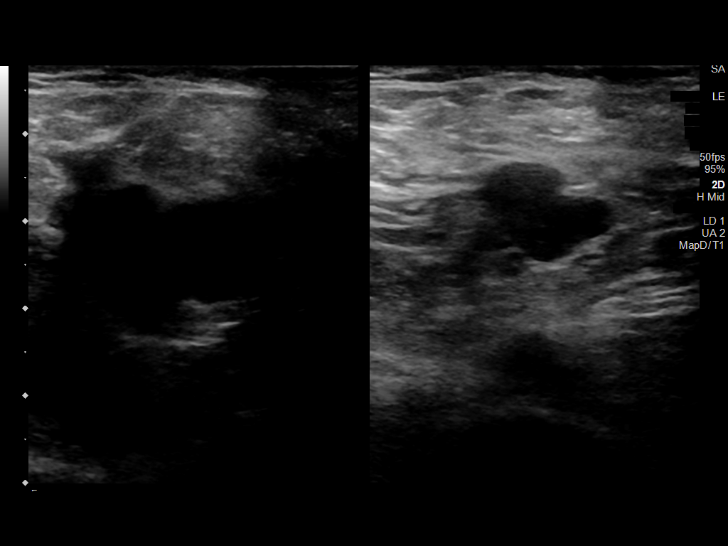
[im 35/35]
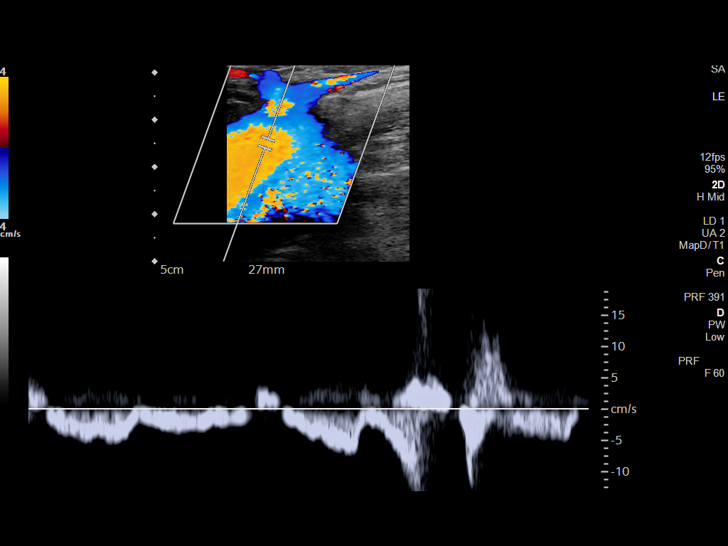

[14 of 24 positions shown; findings below may reference images not displayed]

FINDINGS: VENOUS

Normal compressibility of the common femoral, superficial femoral,
and popliteal veins, as well as the visualized calf veins.
Visualized portions of profunda femoral vein and great saphenous
vein unremarkable. No filling defects to suggest DVT on grayscale or
color Doppler imaging. Doppler waveforms show normal direction of
venous flow, normal respiratory plasticity and response to
augmentation.

Limited views of the contralateral common femoral vein are
unremarkable.

OTHER

Soft tissue edema noted in the ankle region.

Limitations: none
IMPRESSION: No evidence of acute deep venous thrombosis in the visualized lower
extremity veins. Soft tissue edema in the right ankle.

## 2024-04-23 IMAGING — DX DG CHEST 2V
2 series · 2 of 2 positions shown · non-contrast
Comparison: September 09, 2019

CLINICAL DATA: Palpitations.

EXAM:
CHEST - 2 VIEW

[chest pa]
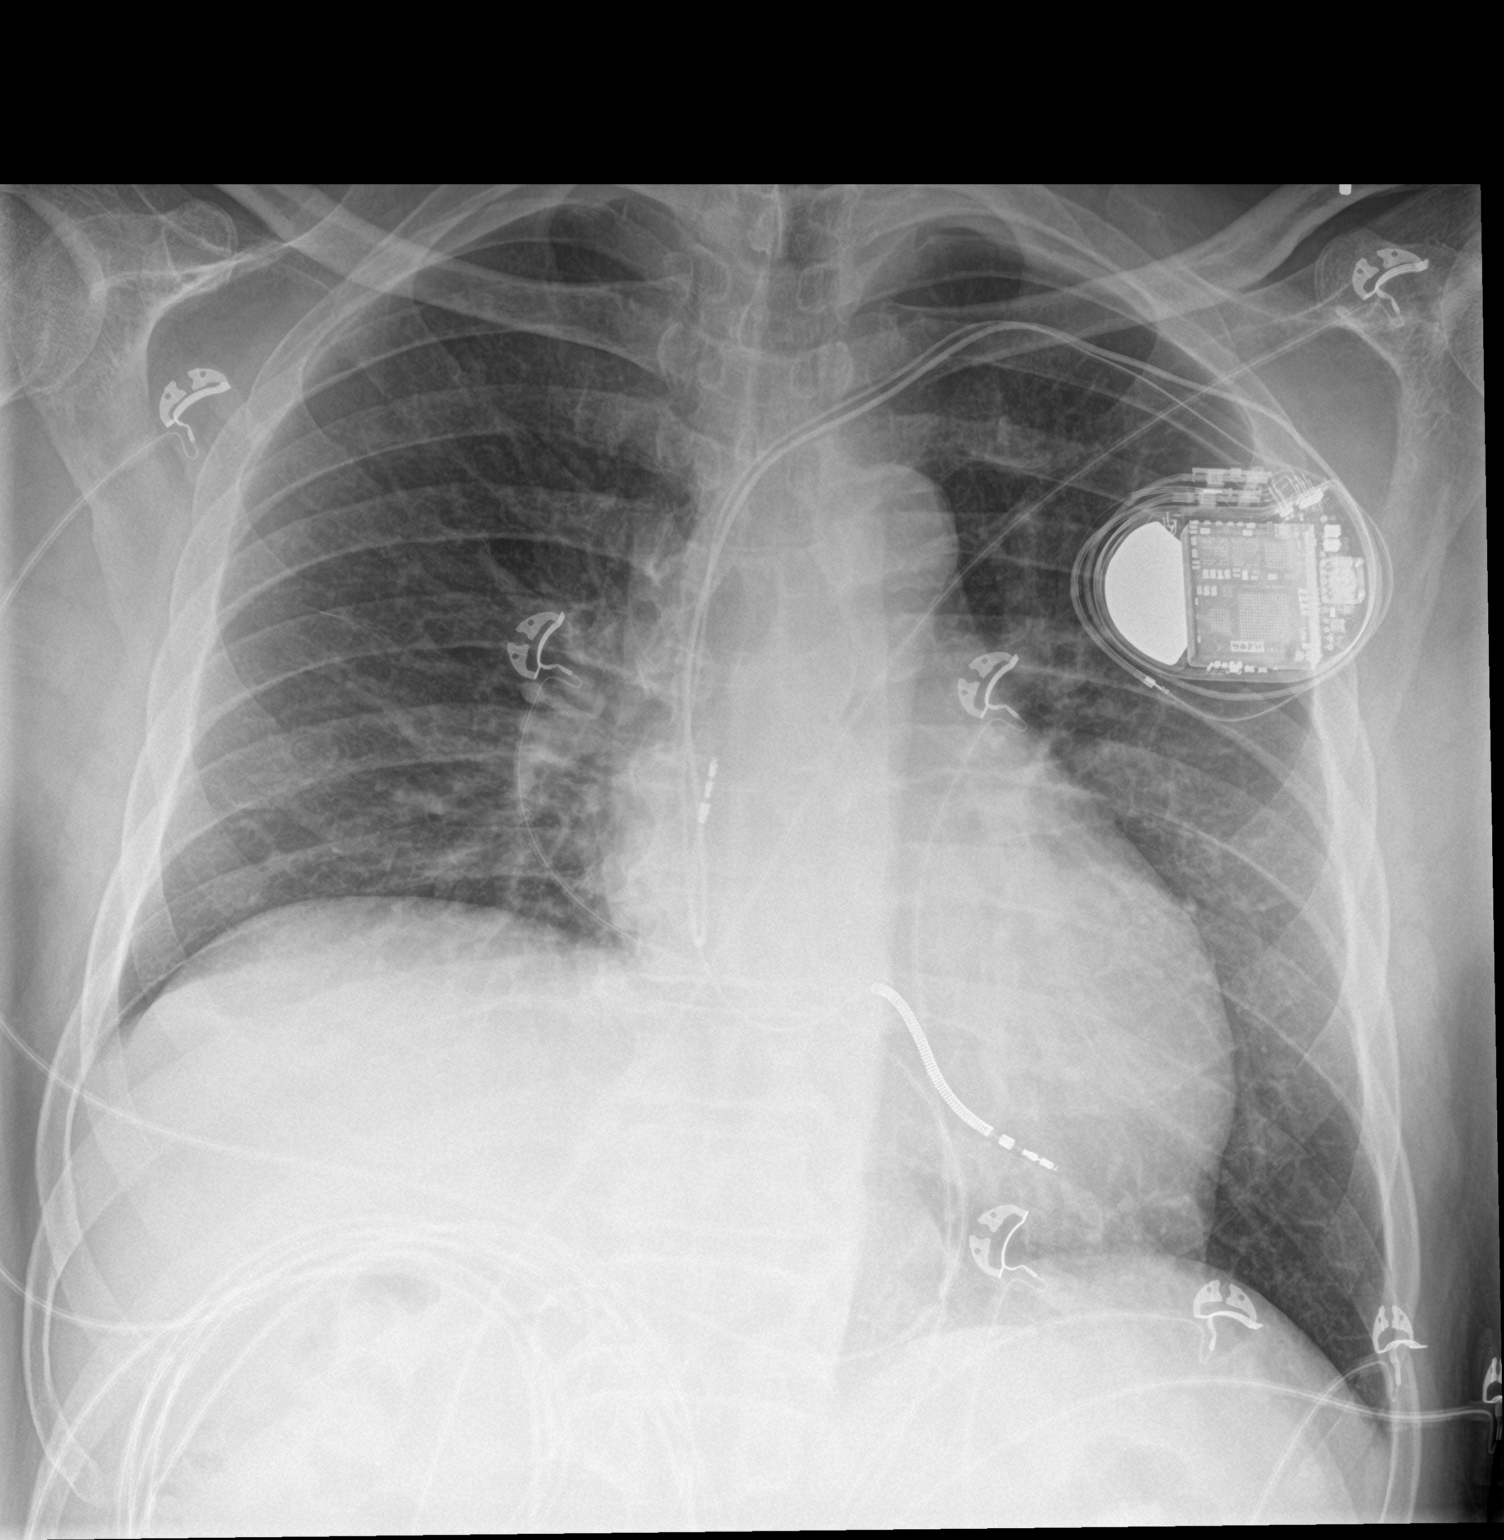

[chest lat]
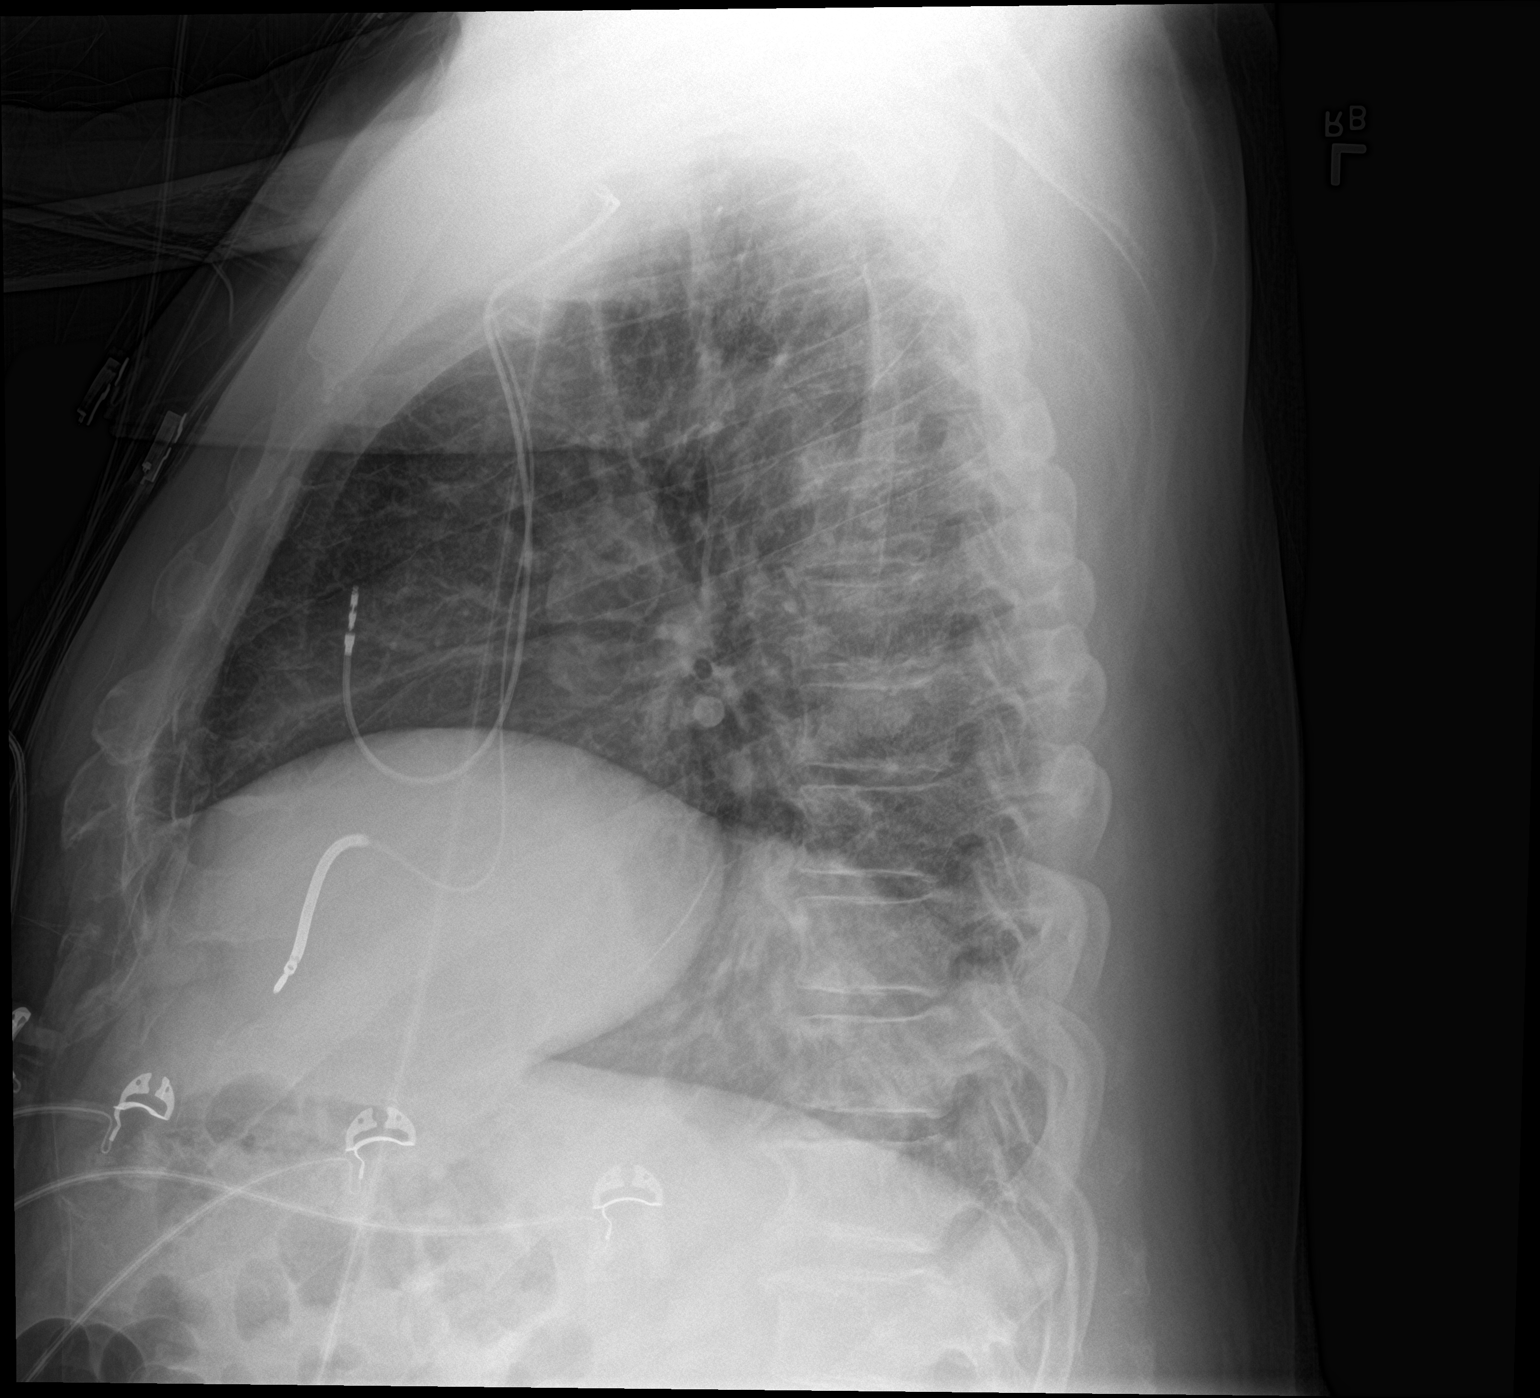

[2 of 2 positions shown; findings below may reference images not displayed]

FINDINGS: There is stable dual lead AICD positioning. The heart size and
mediastinal contours are within normal limits. There is stable
elevation of the right hemidiaphragm. Both lungs are clear. The
visualized skeletal structures are unremarkable.
IMPRESSION: No active cardiopulmonary disease.
